# Patient Record
Sex: Male | Born: 1950 | Race: Black or African American | Hispanic: No | Marital: Married
Health system: Southern US, Community
[De-identification: ages and names within clinical notes are randomized; demographics above are authoritative.]

## PROBLEM LIST (undated history)

## (undated) DIAGNOSIS — K219 Gastro-esophageal reflux disease without esophagitis: Secondary | ICD-10-CM

## (undated) DIAGNOSIS — I10 Essential (primary) hypertension: Secondary | ICD-10-CM

## (undated) DIAGNOSIS — R569 Unspecified convulsions: Secondary | ICD-10-CM

## (undated) DIAGNOSIS — I82409 Acute embolism and thrombosis of unspecified deep veins of unspecified lower extremity: Secondary | ICD-10-CM

## (undated) DIAGNOSIS — E785 Hyperlipidemia, unspecified: Secondary | ICD-10-CM

## (undated) HISTORY — PX: BRAIN SURGERY: SHX531

---

## 2017-01-16 ENCOUNTER — Emergency Department: Payer: Medicare PPO

## 2017-01-16 ENCOUNTER — Inpatient Hospital Stay
Admission: EM | Admit: 2017-01-16 | Discharge: 2017-01-22 | DRG: 393 | Disposition: A | Discharge status: Skilled Nursing Facility | Payer: Medicare PPO | Attending: Internal Medicine | Admitting: Internal Medicine

## 2017-01-16 ENCOUNTER — Inpatient Hospital Stay: Payer: Medicare PPO

## 2017-01-16 ENCOUNTER — Encounter: Payer: Self-pay | Admitting: Emergency Medicine

## 2017-01-16 ENCOUNTER — Other Ambulatory Visit: Payer: Self-pay

## 2017-01-16 DIAGNOSIS — K9423 Gastrostomy malfunction: Principal | ICD-10-CM | POA: Diagnosis present

## 2017-01-16 DIAGNOSIS — Z79899 Other long term (current) drug therapy: Secondary | ICD-10-CM

## 2017-01-16 DIAGNOSIS — T45515A Adverse effect of anticoagulants, initial encounter: Secondary | ICD-10-CM | POA: Diagnosis present

## 2017-01-16 DIAGNOSIS — R569 Unspecified convulsions: Secondary | ICD-10-CM | POA: Diagnosis present

## 2017-01-16 DIAGNOSIS — I10 Essential (primary) hypertension: Secondary | ICD-10-CM | POA: Diagnosis present

## 2017-01-16 DIAGNOSIS — Z7901 Long term (current) use of anticoagulants: Secondary | ICD-10-CM | POA: Diagnosis not present

## 2017-01-16 DIAGNOSIS — D496 Neoplasm of unspecified behavior of brain: Secondary | ICD-10-CM | POA: Diagnosis present

## 2017-01-16 DIAGNOSIS — R5383 Other fatigue: Secondary | ICD-10-CM | POA: Diagnosis present

## 2017-01-16 DIAGNOSIS — A419 Sepsis, unspecified organism: Secondary | ICD-10-CM | POA: Diagnosis present

## 2017-01-16 DIAGNOSIS — Z8673 Personal history of transient ischemic attack (TIA), and cerebral infarction without residual deficits: Secondary | ICD-10-CM

## 2017-01-16 DIAGNOSIS — N390 Urinary tract infection, site not specified: Secondary | ICD-10-CM | POA: Diagnosis present

## 2017-01-16 DIAGNOSIS — F329 Major depressive disorder, single episode, unspecified: Secondary | ICD-10-CM | POA: Diagnosis present

## 2017-01-16 DIAGNOSIS — R791 Abnormal coagulation profile: Secondary | ICD-10-CM | POA: Diagnosis present

## 2017-01-16 DIAGNOSIS — Z86718 Personal history of other venous thrombosis and embolism: Secondary | ICD-10-CM

## 2017-01-16 DIAGNOSIS — R000 Tachycardia, unspecified: Secondary | ICD-10-CM | POA: Diagnosis present

## 2017-01-16 DIAGNOSIS — R05 Cough: Secondary | ICD-10-CM

## 2017-01-16 DIAGNOSIS — R059 Cough, unspecified: Secondary | ICD-10-CM

## 2017-01-16 HISTORY — DX: Essential (primary) hypertension: I10

## 2017-01-16 LAB — URINALYSIS, COMPLETE (UACMP) WITH MICROSCOPIC
Bacteria, UA: NONE SEEN
Bilirubin Urine: NEGATIVE
GLUCOSE, UA: NEGATIVE mg/dL
Hgb urine dipstick: NEGATIVE
KETONES UR: NEGATIVE mg/dL
LEUKOCYTES UA: NEGATIVE
NITRITE: NEGATIVE
PH: 7 (ref 5.0–8.0)
Protein, ur: NEGATIVE mg/dL
Specific Gravity, Urine: 1.012 (ref 1.005–1.030)
Squamous Epithelial / LPF: NONE SEEN

## 2017-01-16 LAB — URINALYSIS, ROUTINE W REFLEX MICROSCOPIC

## 2017-01-16 LAB — CBC WITH DIFFERENTIAL/PLATELET
Basophils Absolute: 0.1 10*3/uL (ref 0–0.1)
Basophils Relative: 1 %
Eosinophils Absolute: 0.1 10*3/uL (ref 0–0.7)
Eosinophils Relative: 1 %
HEMATOCRIT: 46 % (ref 40.0–52.0)
HEMOGLOBIN: 15.1 g/dL (ref 13.0–18.0)
LYMPHS ABS: 1.7 10*3/uL (ref 1.0–3.6)
LYMPHS PCT: 19 %
MCH: 31.1 pg (ref 26.0–34.0)
MCHC: 32.9 g/dL (ref 32.0–36.0)
MCV: 94.7 fL (ref 80.0–100.0)
Monocytes Absolute: 0.6 10*3/uL (ref 0.2–1.0)
Monocytes Relative: 7 %
NEUTROS ABS: 6.8 10*3/uL — AB (ref 1.4–6.5)
Neutrophils Relative %: 72 %
Platelets: 495 10*3/uL — ABNORMAL HIGH (ref 150–440)
RBC: 4.85 MIL/uL (ref 4.40–5.90)
RDW: 15.9 % — ABNORMAL HIGH (ref 11.5–14.5)
WBC: 9.2 10*3/uL (ref 3.8–10.6)

## 2017-01-16 LAB — COMPREHENSIVE METABOLIC PANEL
ALBUMIN: 3.4 g/dL — AB (ref 3.5–5.0)
ALT: 86 U/L — ABNORMAL HIGH (ref 17–63)
ANION GAP: 12 (ref 5–15)
AST: 54 U/L — ABNORMAL HIGH (ref 15–41)
Alkaline Phosphatase: 247 U/L — ABNORMAL HIGH (ref 38–126)
BILIRUBIN TOTAL: 0.6 mg/dL (ref 0.3–1.2)
BUN: 16 mg/dL (ref 6–20)
CHLORIDE: 96 mmol/L — AB (ref 101–111)
CO2: 26 mmol/L (ref 22–32)
Calcium: 9.9 mg/dL (ref 8.9–10.3)
Creatinine, Ser: 0.78 mg/dL (ref 0.61–1.24)
GFR calc Af Amer: >60 mL/min (ref >60)
GFR calc non Af Amer: >60 mL/min (ref >60)
GLUCOSE: 118 mg/dL — AB (ref 65–99)
POTASSIUM: 4.7 mmol/L (ref 3.5–5.1)
Sodium: 134 mmol/L — ABNORMAL LOW (ref 135–145)
TOTAL PROTEIN: 8.7 g/dL — AB (ref 6.5–8.1)

## 2017-01-16 LAB — PROTIME-INR
INR: 1.91
Prothrombin Time: 21.7 seconds — ABNORMAL HIGH (ref 11.4–15.2)

## 2017-01-16 LAB — MRSA PCR SCREENING: MRSA BY PCR: NEGATIVE

## 2017-01-16 LAB — LACTIC ACID, PLASMA
LACTIC ACID, VENOUS: 1.9 mmol/L (ref 0.5–1.9)
LACTIC ACID, VENOUS: 1.7 mmol/L (ref 0.5–1.9)
LACTIC ACID, VENOUS: 1.9 mmol/L (ref 0.5–1.9)
Lactic Acid, Venous: 2.1 mmol/L (ref 0.5–1.9)

## 2017-01-16 LAB — PROCALCITONIN: Procalcitonin: <0.1 ng/mL

## 2017-01-16 LAB — PHENYTOIN LEVEL, TOTAL: PHENYTOIN LVL: 17.5 ug/mL (ref 10.0–20.0)

## 2017-01-16 MED ORDER — ACETAMINOPHEN 325 MG PO TABS
ORAL_TABLET | ORAL | Status: AC
  Administered 2017-01-16: 650 mg via ORAL
  Filled 2017-01-16: qty 2

## 2017-01-16 MED ORDER — PIPERACILLIN-TAZOBACTAM 3.375 G IVPB 30 MIN
3.3750 g | Freq: Once | INTRAVENOUS | Status: AC
  Administered 2017-01-16: 3.375 g via INTRAVENOUS
  Filled 2017-01-16 (×2): qty 50

## 2017-01-16 MED ORDER — FREE WATER
180.0000 mL | Freq: Every day | Status: DC
  Administered 2017-01-16 – 2017-01-22 (×25): 180 mL

## 2017-01-16 MED ORDER — LEVETIRACETAM 100 MG/ML PO SOLN
1000.0000 mg | Freq: Two times a day (BID) | ORAL | Status: DC
  Administered 2017-01-16 – 2017-01-22 (×12): 1000 mg
  Filled 2017-01-16 (×14): qty 10

## 2017-01-16 MED ORDER — PHENYTOIN 125 MG/5ML PO SUSP
150.0000 mg | Freq: Three times a day (TID) | ORAL | Status: DC
  Administered 2017-01-16 – 2017-01-22 (×19): 150 mg
  Filled 2017-01-16 (×21): qty 8

## 2017-01-16 MED ORDER — PIPERACILLIN-TAZOBACTAM 3.375 G IVPB
3.3750 g | Freq: Three times a day (TID) | INTRAVENOUS | Status: DC
  Administered 2017-01-16 – 2017-01-18 (×5): 3.375 g via INTRAVENOUS
  Filled 2017-01-16 (×5): qty 50

## 2017-01-16 MED ORDER — SULFAMETHOXAZOLE-TRIMETHOPRIM 800-160 MG PO TABS
1.0000 | ORAL_TABLET | Freq: Two times a day (BID) | ORAL | Status: DC
  Filled 2017-01-16 (×2): qty 1

## 2017-01-16 MED ORDER — AMANTADINE HCL 50 MG/5ML PO SYRP
100.0000 mg | ORAL_SOLUTION | Freq: Two times a day (BID) | ORAL | Status: DC
  Administered 2017-01-16 – 2017-01-22 (×12): 100 mg
  Filled 2017-01-16 (×14): qty 10

## 2017-01-16 MED ORDER — VANCOMYCIN HCL 10 G IV SOLR
1500.0000 mg | Freq: Once | INTRAVENOUS | Status: AC
  Administered 2017-01-16: 15:00:00 1500 mg via INTRAVENOUS
  Filled 2017-01-16 (×2): qty 1500

## 2017-01-16 MED ORDER — PHENYTOIN 125 MG/5ML PO SUSP
150.0000 mg | Freq: Once | ORAL | Status: AC
  Administered 2017-01-16: 150 mg via ORAL
  Filled 2017-01-16: qty 8

## 2017-01-16 MED ORDER — MAGIC MOUTHWASH
10.0000 mL | Freq: Three times a day (TID) | ORAL | Status: DC
  Administered 2017-01-16 – 2017-01-22 (×16): 10 mL via ORAL
  Filled 2017-01-16 (×14): qty 10

## 2017-01-16 MED ORDER — METOPROLOL TARTRATE 50 MG PO TABS
100.0000 mg | ORAL_TABLET | Freq: Once | ORAL | Status: AC
  Administered 2017-01-16: 100 mg via ORAL
  Filled 2017-01-16: qty 2

## 2017-01-16 MED ORDER — TAMSULOSIN HCL 0.4 MG PO CAPS
0.4000 mg | ORAL_CAPSULE | Freq: Once | ORAL | Status: AC
Start: 2017-01-16 — End: 2017-01-16
  Administered 2017-01-16: 0.4 mg via ORAL
  Filled 2017-01-16: qty 1

## 2017-01-16 MED ORDER — VANCOMYCIN HCL IN DEXTROSE 1-5 GM/200ML-% IV SOLN
1000.0000 mg | Freq: Two times a day (BID) | INTRAVENOUS | Status: DC
  Administered 2017-01-17 – 2017-01-18 (×3): 1000 mg via INTRAVENOUS
  Filled 2017-01-16 (×4): qty 200

## 2017-01-16 MED ORDER — CEFTRIAXONE SODIUM 2 G IJ SOLR
2.0000 g | Freq: Once | INTRAMUSCULAR | Status: AC
  Administered 2017-01-16: 2 g via INTRAVENOUS
  Filled 2017-01-16: qty 2

## 2017-01-16 MED ORDER — DIATRIZOATE MEGLUMINE & SODIUM 66-10 % PO SOLN
60.0000 mL | Freq: Once | ORAL | Status: AC
  Administered 2017-01-16: 60 mL

## 2017-01-16 MED ORDER — SENNOSIDES 8.8 MG/5ML PO SYRP
8.8000 mg | ORAL_SOLUTION | Freq: Every day | ORAL | Status: DC
  Administered 2017-01-16 – 2017-01-21 (×5): 8.8 mg
  Filled 2017-01-16 (×7): qty 5

## 2017-01-16 MED ORDER — ATORVASTATIN CALCIUM 10 MG PO TABS
10.0000 mg | ORAL_TABLET | Freq: Every day | ORAL | Status: DC
  Administered 2017-01-16 – 2017-01-21 (×6): 10 mg
  Filled 2017-01-16 (×7): qty 1

## 2017-01-16 MED ORDER — MELATONIN 5 MG PO TABS
5.0000 mg | ORAL_TABLET | Freq: Every day | ORAL | Status: DC
  Administered 2017-01-17 – 2017-01-21 (×5): 5 mg
  Filled 2017-01-16 (×7): qty 1

## 2017-01-16 MED ORDER — ACETAMINOPHEN 325 MG PO TABS
650.0000 mg | ORAL_TABLET | Freq: Four times a day (QID) | ORAL | Status: DC | PRN
  Administered 2017-01-17: 21:00:00 650 mg
  Filled 2017-01-16: qty 2

## 2017-01-16 MED ORDER — OSMOLITE 1.5 CAL PO LIQD
237.0000 mL | Freq: Every day | ORAL | Status: DC
  Administered 2017-01-16 – 2017-01-22 (×25): 237 mL

## 2017-01-16 MED ORDER — POLYETHYLENE GLYCOL 3350 17 G PO PACK
17.0000 g | PACK | Freq: Every day | ORAL | Status: DC
  Administered 2017-01-16 – 2017-01-20 (×4): 17 g
  Filled 2017-01-16 (×6): qty 1

## 2017-01-16 MED ORDER — VANCOMYCIN HCL IN DEXTROSE 1-5 GM/200ML-% IV SOLN: 1000.0000 mg | Freq: Once | INTRAVENOUS | Status: DC

## 2017-01-16 MED ORDER — ACETAMINOPHEN 325 MG PO TABS
650.0000 mg | ORAL_TABLET | Freq: Once | ORAL | Status: AC
Start: 2017-01-16 — End: 2017-01-16
  Administered 2017-01-16: 650 mg via ORAL

## 2017-01-16 MED ORDER — PRO-STAT SUGAR FREE PO LIQD
30.0000 mL | Freq: Two times a day (BID) | ORAL | Status: DC
  Administered 2017-01-16 – 2017-01-22 (×13): 30 mL

## 2017-01-16 MED ORDER — LEVETIRACETAM 500 MG PO TABS: 1000.0000 mg | ORAL_TABLET | Freq: Two times a day (BID) | ORAL | Status: DC

## 2017-01-16 MED ORDER — METOPROLOL TARTRATE 5 MG/5ML IV SOLN
5.0000 mg | Freq: Once | INTRAVENOUS | Status: AC
  Administered 2017-01-16: 5 mg via INTRAVENOUS
  Filled 2017-01-16: qty 5

## 2017-01-16 MED ORDER — LEVETIRACETAM 500 MG PO TABS
1000.0000 mg | ORAL_TABLET | Freq: Once | ORAL | Status: AC
  Administered 2017-01-16: 1000 mg via ORAL
  Filled 2017-01-16: qty 2

## 2017-01-16 MED ORDER — SODIUM CHLORIDE 0.9 % IV SOLN
INTRAVENOUS | Status: AC
  Administered 2017-01-16 – 2017-01-17 (×2): via INTRAVENOUS

## 2017-01-16 MED ORDER — METOPROLOL TARTRATE 50 MG PO TABS
100.0000 mg | ORAL_TABLET | Freq: Two times a day (BID) | ORAL | Status: DC
  Administered 2017-01-16 – 2017-01-22 (×12): 100 mg
  Filled 2017-01-16 (×12): qty 2

## 2017-01-16 NOTE — ED Notes (Signed)
[place] attempted to flush G-tube but no success

## 2017-01-16 NOTE — NC FL2 (Signed)
[place] LEVEL OF CARE SCREENING TOOL     IDENTIFICATION  Patient Name: [place] Birthdate: 07-08-1950 Sex: male Admission Date (Current Location): 01/16/2017  [facility] and [place] Number:  Selena Lesser ([id number] [phone]) Facility and Address:  [facility], [address], [place], [place] [address]      Provider Number: [id number]  Attending Physician Name and Address:  Nicholes Mango, MD  Relative Name and Phone Number:       Current Level of Care: Hospital Recommended Level of Care: [place] Prior Approval Number:    Date Approved/Denied:   PASRR Number: [id number])  Discharge Plan: SNF    Current Diagnoses: Patient Active Problem List   Diagnosis Date Noted  . Sepsis ([place]) 01/16/2017    Orientation RESPIRATION BLADDER Height & Weight     Self  Normal Continent Weight: 188 lb 11.2 oz (85.6 kg) Height:  5\' 3"  (160 cm)  BEHAVIORAL SYMPTOMS/MOOD NEUROLOGICAL BOWEL NUTRITION STATUS      Continent Feeding tube(PEG)  AMBULATORY STATUS COMMUNICATION OF NEEDS Skin   Extensive Assist Verbally Normal                       Personal Care Assistance Level of Assistance  Bathing, Feeding, Dressing Bathing Assistance: Limited assistance Feeding assistance: Limited assistance Dressing Assistance: Limited assistance     Functional Limitations Info  Sight, Hearing, Speech Sight Info: Impaired Hearing Info: Impaired Speech Info: Impaired    SPECIAL CARE FACTORS FREQUENCY  PT (By licensed PT), OT (By licensed OT)     PT Frequency: (5) OT Frequency: (5)            Contractures      Additional Factors Info  Code Status, Allergies Code Status Info: (not on file. ) Allergies Info: (not on file. )           Current Medications (01/16/2017):  This is the current hospital active medication list Current Facility-Administered Medications  Medication Dose Route Frequency Provider Last Rate Last Dose  . 0.9  %  sodium chloride infusion   Intravenous Continuous Gouru, Aruna, MD 75 mL/hr at 01/16/17 1341    . acetaminophen (TYLENOL) tablet 650 mg  650 mg Per Tube Q6H PRN Gouru, Aruna, MD      . amantadine (SYMMETREL) 50 MG/5ML solution 100 mg  100 mg Per Tube BID Gouru, Aruna, MD   100 mg at 01/16/17 1724  . atorvastatin (LIPITOR) tablet 10 mg  10 mg Per Tube q1800 Gouru, Aruna, MD   10 mg at 01/16/17 1724  . feeding supplement (OSMOLITE 1.5 CAL) liquid 237 mL  237 mL Per Tube 5 X Daily Gouru, Aruna, MD   237 mL at 01/16/17 1724  . feeding supplement (PRO-STAT SUGAR FREE 64) liquid 30 mL  30 mL Per Tube BID Gouru, Aruna, MD   30 mL at 01/16/17 1725  . free water 180 mL  180 mL Per Tube 5 X Daily Gouru, Aruna, MD   180 mL at 01/16/17 1725  . levETIRAcetam (KEPPRA) 100 MG/ML solution 1,000 mg  1,000 mg Per Tube BID Gouru, Aruna, MD      . Melatonin TABS 5 mg  5 mg Per Tube QHS Gouru, Aruna, MD      . metoprolol tartrate (LOPRESSOR) tablet 100 mg  100 mg Per Tube BID Gouru, Aruna, MD      . phenytoin (DILANTIN) 125 MG/5ML suspension 150 mg  150 mg Per Tube Q8H Nicholes Mango, MD  150 mg at 01/16/17 1724  . piperacillin-tazobactam (ZOSYN) IVPB 3.375 g  3.375 g Intravenous Q8H James, Teldrin D, RPH      . polyethylene glycol (MIRALAX / GLYCOLAX) packet 17 g  17 g Per Tube Daily Gouru, Aruna, MD   17 g at 01/16/17 1724  . sennosides (SENOKOT) 8.8 MG/5ML syrup 8.8 mg  8.8 mg Per Tube Daily Gouru, Aruna, MD   8.8 mg at 01/16/17 1724  . [START ON 01/17/2017] vancomycin (VANCOCIN) IVPB 1000 mg/200 mL premix  1,000 mg Intravenous Q12H Larene Beach, [facility]         Discharge Medications: Please see discharge summary for a list of discharge medications.  Relevant Imaging Results:  Relevant Lab Results:   Additional Information (SSN: [id number])  Braeleigh Pyper, Veronia Beets, LCSW

## 2017-01-16 NOTE — Clinical Social Work Note (Signed)
Clinical Social Work Assessment  Patient Details  Name: [place] Jackson MRN: [id number] Date of Birth: 03-20-1950  Date of referral:  01/16/17               Reason for consult:  Other (Comment Required)(From [place] [facility] STR. )                Permission sought to share information with:  Chartered certified accountant granted to share information::  Yes, Verbal Permission Granted  Name::        Agency::     Relationship::     Contact Information:     Housing/Transportation Living arrangements for the past 2 months:  Single Family Home Source of Information:  Adult Children Patient Interpreter Needed:  None Criminal Activity/Legal Involvement Pertinent to Current Situation/Hospitalization:  No - Comment as needed Significant Relationships:  Adult Children, Siblings, Spouse Lives with:  Spouse Do you feel safe going back to the place where you live?  Yes Need for family participation in patient care:  Yes (Comment)  Care giving concerns:  Patient came to [place] from [place] where he was at short term rehab for 2 days.    Social Worker assessment / plan:  Holiday representative (CSW) reviewed chart and noted that patient is from [place]. Per [place] liaison patient is a short term rehab resident and has only been there for 2 days and came from [facility]. Per [place] patient can return to [place] if a new [place] SNF authorization through [place] is received. CSW met with patient's daughter [place] outside of patient's room. Per daughter patient and his wife [place] live in [place], [place] and daughter lives in [place]. Daughter is agreeable for patient to return to [place]. CSW explained to daughter that patient's [place] will have to approve SNF all over again before he can return to [place]. Daughter verbalized her understanding. FL2 complete. CSW will continue to follow and assist as needed.   Employment status:  Disabled (Comment on whether or not currently receiving  Disability) Insurance information:  [place], [employer] PT Recommendations:  Not assessed at this time Information / Referral to community resources:  [place]  Patient/Family's Response to care:  Patient's daughter is agreeable for patient to return to [place].   Patient/Family's Understanding of and Emotional Response to Diagnosis, Current Treatment, and Prognosis:  Patient's daughter was very pleasant and thanked CSW for assistance.   Emotional Assessment Appearance:  Appears stated age Attitude/Demeanor/Rapport:  Unable to Assess Affect (typically observed):  Unable to Assess Orientation:  Oriented to Self, Fluctuating Orientation (Suspected and/or reported Sundowners) Alcohol / Substance use:  Not Applicable Psych involvement (Current and /or in the community):  No (Comment)  Discharge Needs  Concerns to be addressed:  Discharge Planning Concerns Readmission within the last 30 days:  No Current discharge risk:  Dependent with Mobility, Chronically ill Barriers to Discharge:  Continued Medical Work up   [employer], Veronia Beets, LCSW 01/16/2017, 6:24 PM

## 2017-01-16 NOTE — ED Provider Notes (Signed)
Patient has had a prolonged ER course.  I placed a G-tube for him.  He was an 32 [place] which is what he was using.  Family is concerned because he has dealt with sepsis in the past.  His lactic acid is slightly elevated at 2.1 and he is tachycardic otherwise I am not convinced that he is septic.  He would need hospital observation.  At this point he would have systemic inflammatory response.  We gave him his home medications.  Initially family consider transferring him back to [facility] because [facility].  Currently they are comfortable with keeping him here.   Earleen Newport, MD 01/16/17 1139

## 2017-01-16 NOTE — ED Notes (Signed)
ED Provider at bedside. 

## 2017-01-16 NOTE — ED Notes (Signed)
Family member at bedside; requests IV be removed from right arm as he has hx of blood clots and they were told to never have any IV placed there. Pt also reports recent treatment for UTI; has days remaining on antibiotics.

## 2017-01-16 NOTE — H&P (Signed)
[place] [facility] [place] NAME: [place]    MR#:  [id number]  DATE OF BIRTH:  03-Mar-1950  DATE OF ADMISSION:  01/16/2017  PRIMARY CARE PHYSICIAN: System, Provider Not In   REQUESTING/REFERRING PHYSICIAN: Earleen Newport, MD  CHIEF COMPLAINT:  Clogged PEG tube, eventually patient became tachycardic in the ED  HISTORY OF PRESENT ILLNESS:  [place]  is a 67 y.o. 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  G-tube was replaced in the emergency department but patient was tachycardic and has low-grade fever and elevated lactic acid.  Patient is started on broad-spectrum IV antibiotics for code sepsis and hospitalist team was called to admit the patient.  Patient is lethargic during my examination.  Daughter is at bedside  PAST MEDICAL HISTORY:   Past Medical History:  Diagnosis Date  . Hypertension     PAST SURGICAL HISTOIRY:  Status post brain tumor resection  SOCIAL HISTORY:   Social History   Tobacco Use  . Smoking status: Not on file  Substance Use Topics  . Alcohol use: Not on file    FAMILY HISTORY:  No family history on file.  DRUG ALLERGIES:  Not on File  REVIEW OF SYSTEMS:  Review of system unobtainable as the patient is lethargic during my examination  MEDICATIONS AT HOME:   Prior to Admission medications   Medication Sig Start Date End Date Taking? Authorizing Provider  acetaminophen (TYLENOL) 325 MG tablet Place 650 mg into feeding tube every 6 (six) hours as needed.   Yes [provider]  amantadine (SYMMETREL) 50 MG/5ML solution Place 100 mg into feeding tube 2 (two) times daily.   Yes [provider]  atorvastatin  (LIPITOR) 10 MG tablet Place 10 mg into feeding tube daily.    Yes [provider]  bacitracin ointment Apply 1 application topically 2 (two) times daily.   Yes [provider]  levETIRAcetam (KEPPRA) 1000 MG tablet Place 1,000 mg into feeding tube 2 (two) times daily.   Yes [provider]  Melatonin 3 MG TABS Place 6 mg into feeding tube at bedtime.   Yes [provider]  metoprolol tartrate (LOPRESSOR) 100 MG tablet Place 100 mg into feeding tube 2 (two) times daily.   Yes [provider]  phenytoin (DILANTIN) 125 MG/5ML suspension Place 150 mg into feeding tube every 8 (eight) hours.   Yes [provider]  polyethylene glycol (MIRALAX / GLYCOLAX) packet Place 17 g into feeding tube daily.   Yes [provider]  senna (SENOKOT) 8.6 MG tablet Place 1 tablet into feeding tube daily.   Yes [provider]  sulfamethoxazole-trimethoprim (BACTRIM DS,SEPTRA DS) 800-160 MG tablet Place 1 tablet into feeding tube 2 (two) times daily.    Yes [provider]  tamsulosin (FLOMAX) 0.4 MG CAPS capsule 0.4 mg daily after breakfast.   Yes [provider]  warfarin (COUMADIN) 5 MG tablet Place 5 mg into feeding tube daily.    Yes [provider]      VITAL SIGNS:  Blood pressure (!) 133/97, pulse (!) 104, temperature 97.9 F (36.6 C), temperature source Oral, resp. rate 20, height 5\' 3"  (1.6 m), weight 85.6 kg (188 lb 11.2  oz), SpO2 92 %.  PHYSICAL EXAMINATION:  GENERAL:  67 y.o.-year-old patient lying in the bed with no acute distress.  EYES: Pupils equal, round, reactive to light and accommodation. No scleral icterus.  HEENT: Head atraumatic, normocephalic.  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 No thyroid enlargement, no tenderness.  LUNGS: Normal breath sounds bilaterally, no wheezing, rales,rhonchi or crepitation. No use of accessory muscles of  respiration.  CARDIOVASCULAR: S1, S2 normal. No murmurs, rubs, or gallops.  ABDOMEN: Soft, nontender, nondistended. Bowel sounds present. No organomegaly or mass.  EXTREMITIES: No pedal edema, cyanosis, or clubbing.  NEUROLOGIC: Patient is arousable but falling asleep lethargic, scar on the left side of the brain is healing well PSYCHIATRIC: The patient is disoriented SKIN: No obvious rash, lesion, or ulcer.   LABORATORY PANEL:   CBC Recent Labs  Lab 01/16/17 0640  WBC 9.2  HGB 15.1  HCT 46.0  PLT 495*   ------------------------------------------------------------------------------------------------------------------  Chemistries  Recent Labs  Lab 01/16/17 0640  NA 134*  K 4.7  CL 96*  CO2 26  GLUCOSE 118*  BUN 16  CREATININE 0.78  CALCIUM 9.9  AST 54*  ALT 86*  ALKPHOS 247*  BILITOT 0.6   ------------------------------------------------------------------------------------------------------------------  Cardiac Enzymes No results for input(s): TROPONINI in the last 168 hours. ------------------------------------------------------------------------------------------------------------------  RADIOLOGY:  Dg Chest 1 View  Result Date: 01/16/2017 CLINICAL DATA:  Fever EXAM: CHEST 1 VIEW COMPARISON:  None. FINDINGS: Heart is normal size. Tortuosity of the thoracic aorta. No confluent airspace opacities or effusions. No acute bony abnormality. 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 IMPRESSION: No active disease. Electronically Signed   By: Rolm Baptise M.D.   On: 01/16/2017 09:24   Dg Abdomen 1 View  Result Date: 01/16/2017 CLINICAL DATA:  Enteric tube replacement EXAM: ABDOMEN - 1 VIEW COMPARISON:  None. FINDINGS: Gastrostomy tube terminates over the body of the stomach. Enteric contrast pools in the gastric fundus. Retained enteric contrast is noted throughout the large bowel. No evidence of extraluminal enteric contrast. No disproportionately dilated small bowel  loops. No evidence of pneumatosis or pneumoperitoneum. VP shunt catheter overlies bilateral abdomen with the tip not seen on this radiograph. No radiopaque nephrolithiasis. Moderate lumbar spondylosis. IMPRESSION: Gastrostomy tube terminates over the body of the stomach. Enteric contrast pools in the gastric fundus. No evidence of extraluminal enteric contrast. Nonobstructive bowel gas pattern. Electronically Signed   By: Ilona Sorrel M.D.   On: 01/16/2017 09:06   Dg Chest Port 1 View  Result Date: 01/16/2017 CLINICAL DATA:  Sepsis.  Hypertension. EXAM: PORTABLE CHEST 1 VIEW COMPARISON:  01/17/2016 earlier the same date. FINDINGS: 1308 hr. The heart size and mediastinal contours are stable. There are lower lung volumes with mildly increased patchy opacity at both lung bases, probably atelectasis. Early aspiration is possible. There is no pneumothorax or significant pleural effusion. 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 No acute osseous findings. IMPRESSION: Mildly increased patchy opacities at both lung bases compared with earlier study, radiographically favored to reflect atelectasis. Early aspiration cannot be excluded in this clinical setting. Electronically Signed   By: Richardean Sale M.D.   On: 01/16/2017 13:34    EKG:   Orders placed or performed during the hospital encounter of 01/16/17  . ED EKG 12-Lead  . ED EKG 12-Lead  . EKG 12-Lead  . EKG 12-Lead    IMPRESSION AND PLAN:   [place]  is a 67 y.o. 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  G-tube was replaced in the emergency department but patient was tachycardic and has low-grade fever and elevated lactic acid.  #Sepsis-unclear  etiology Admit to MedSurg unit Patient meets septic criteria with tachycardia elevated lactic acid and fever at the time of admission Broad-spectrum IV antibiotics Blood cultures, urine cultures ordered and chest x-ray Hydrate with IV fluids and monitor lactic acid levels  #Mechanical obstruction of the PEG tube PEG tube was replaced in the emergency department and functioning well now Resume feeds and monitor closely  #History of CVA during brain tumor resection Patient is on Coumadin INR is subtherapeutic Coumadin management by pharmacy  #History of brain tumor status post resection in October 2018  at [place] follow-up with [facility] neurosurgery as recommended No interventions needed at this time   #History of seizures Check Keppra and Dilantin level and resume if levels are normal  Provide GI and DVT prophylaxis with coumadin      All the records are reviewed and case discussed with ED provider. Management plans discussed with the patient, family and they are in agreement.  CODE STATUS: fc ; patient's wife, son and daughter healthcare power of attorney  TOTAL TIME TAKING CARE OF THIS PATIENT: 45 minutes.   Note: This dictation was prepared with Dragon dictation along with smaller phrase technology. Any transcriptional errors that result from this process are unintentional.  Nicholes Mango M.D on 01/16/2017 at 2:08 PM  Between 7am to 6pm - Pager - [phone]  After 6pm go to [email or web address] - password [email or web address]  [facility]  Office  [phone]  CC: Primary care physician; System, Provider Not In

## 2017-01-16 NOTE — Progress Notes (Signed)
ANTIBIOTIC CONSULT NOTE - INITIAL  Pharmacy Consult for Vancomycin and Zosyn Indication: sepsis   Not on File  Patient Measurements: Height: 5\' 3"  (160 cm) Weight: 188 lb 11.2 oz (85.6 kg) IBW/kg (Calculated) : 56.9 Adjusted Body Weight:   Vital Signs: Temp: 97.9 F (36.6 C) (01/09 1302) Temp Source: Oral (01/09 1302) BP: 133/97 (01/09 1302) Pulse Rate: 104 (01/09 1302) Intake/Output from previous day: No intake/output data recorded. Intake/Output from this shift: Total I/O In: 50 [IV Piggyback:50] Out: -   Labs: Recent Labs    01/16/17 0640  WBC 9.2  HGB 15.1  PLT 495*  CREATININE 0.78   Estimated Creatinine Clearance: 85.5 mL/min (by C-G formula based on SCr of 0.78 mg/dL). No results for input(s): VANCOTROUGH, VANCOPEAK, VANCORANDOM, GENTTROUGH, GENTPEAK, GENTRANDOM, TOBRATROUGH, TOBRAPEAK, TOBRARND, AMIKACINPEAK, AMIKACINTROU, AMIKACIN in the last 72 hours.   Microbiology: No results found for this or any previous visit (from the past 720 hour(s)).  Medical History: Past Medical History:  Diagnosis Date  . Hypertension     Medications:  Medications Prior to Admission  Medication Sig Dispense Refill Last Dose  . acetaminophen (TYLENOL) 325 MG tablet Place 650 mg into feeding tube every 6 (six) hours as needed.   prn at prn  . amantadine (SYMMETREL) 50 MG/5ML solution Place 100 mg into feeding tube 2 (two) times daily.   01/15/2017 at 1800  . atorvastatin (LIPITOR) 10 MG tablet Place 10 mg into feeding tube daily.    01/15/2017 at 1800  . bacitracin ointment Apply 1 application topically 2 (two) times daily.   01/15/2017 at 1500  . levETIRAcetam (KEPPRA) 1000 MG tablet Place 1,000 mg into feeding tube 2 (two) times daily.   01/15/2017 at 2100  . Melatonin 3 MG TABS Place 6 mg into feeding tube at bedtime.   01/15/2017 at 2000  . metoprolol tartrate (LOPRESSOR) 100 MG tablet Place 100 mg into feeding tube 2 (two) times daily.   01/15/2017 at 1800  . phenytoin (DILANTIN)  125 MG/5ML suspension Place 150 mg into feeding tube every 8 (eight) hours.   01/15/2017 at 2200  . polyethylene glycol (MIRALAX / GLYCOLAX) packet Place 17 g into feeding tube daily.   01/15/2017 at 0900  . senna (SENOKOT) 8.6 MG tablet Place 1 tablet into feeding tube daily.   01/15/2017 at 2000  . sulfamethoxazole-trimethoprim (BACTRIM DS,SEPTRA DS) 800-160 MG tablet Place 1 tablet into feeding tube 2 (two) times daily.    01/15/2017 at 1800  . tamsulosin (FLOMAX) 0.4 MG CAPS capsule 0.4 mg daily after breakfast.   01/15/2017 at 0900  . warfarin (COUMADIN) 5 MG tablet Place 5 mg into feeding tube daily.    01/15/2017 at 1800   Scheduled:  . amantadine  100 mg Per Tube BID  . atorvastatin  10 mg Per Tube q1800  . levETIRAcetam  1,000 mg Per Tube BID  . Melatonin  5 mg Per Tube QHS  . metoprolol tartrate  100 mg Per Tube BID  . phenytoin  150 mg Per Tube Q8H  . polyethylene glycol  17 g Per Tube Daily  . sennosides  8.8 mg Per Tube Daily  . sulfamethoxazole-trimethoprim  1 tablet Per Tube BID   Assessment: Pharmacy consulted to dose and monitor Vancomycin and Zosyn in this 67 year old male being treated for sepsis.   Goal of Therapy:  Vancomycin trough level 15-20 mcg/ml  Plan:  Vancomycin: Will give Vancomycin 1500 mg IV x 1 then start Vancomycin 1 g IV  q12 hours.   Zosyn: Will start Zosyn 3.375 g IV q8h  Yamil Oelke D 01/16/2017,1:47 PM

## 2017-01-16 NOTE — ED Notes (Signed)
All meds given per tube; tube flushed before and after

## 2017-01-16 NOTE — Progress Notes (Signed)
Initial Nutrition Assessment  DOCUMENTATION CODES:   Obesity unspecified  INTERVENTION:  Provide Osmolite 1.5 Cal 1 can 5 times daily + Pro-Stat 30 ml BID via G-tube. Provides 1975 kcal, 105 grams protein, 905 mL H2O daily.  Recommend free water flush of 90 mL before and after each bolus tube feeding. Provides 1805 mL H2O daily including water in tube feeding.  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   -According to paper chart patient is on Isosource 1.5 270 mL 5 times daily (0800, 1100, 1600, 1800, 2000) at [employer]. This is 1350 mL of tube feeding, which provides 2025 kcal, 92 grams of protein, 1031 mL H2O, 20.5 grams fiber daily. He also receives free water flush of 180 mL 5 times daily. He is also on a pureed diet.  Met with patient, his wife, and his daughter at bedside. They confirm that patient is on a pureed diet, but report he only eats small bites at a time. Asked family about TF provision, because as Isosource 1.5 comes in 250 mL bottles, if they were going to provide 270 mL at each bolus feeding, it would be one bottle + 20 mL from another. Patient's daughter, who has spent time with him at  [facility], reports they only provide 1 bottle 5 times daily.  They report patient has lost a significant amount of weight. They report he was 239 lbs on 10/08/2016 and has now lost down to current weight of 188.7 lbs. That is a weight loss of 50.3 lbs (21% body weight) over 3 months, which is significant for time frame. No weights available in Epic, Chart Review, or paper chart to trend.  Access: 18 Fr. Kangaroo G-tube replaced 1/9 in ER after initial G-tube was clogged; terminates in body of stomach per abdominal x-ray 1/9  Medications reviewed and include: Keppra, Miralax, Senokot, NS @ 75 mL/hr, Zosyn, vancomycin.  Labs reviewed: Sodium 134, Chloride 96, Glucose 118, Lactic Acid 2.1.  Patient is at risk for malnutrition.  Discussed with RN.  NUTRITION - FOCUSED PHYSICAL EXAM:    Most Recent Value  Orbital Region  No depletion  Upper Arm Region  Mild depletion  Thoracic and Lumbar Region  No depletion  Buccal Region  No depletion  Temple Region  Moderate depletion [on right side,  difficult to assess left temporalis muscle as pt s/p 3 craniotomies/resections]  Clavicle Bone Region  Mild depletion  Clavicle and Acromion Bone Region  Mild depletion  Scapular Bone Region  Mild depletion  Dorsal Hand  No depletion  Patellar Region  Moderate depletion  Anterior Thigh Region  Moderate depletion  Posterior Calf Region  Moderate depletion  Edema (RD Assessment)  None  Hair  Reviewed  Eyes  Reviewed  Mouth  Reviewed  Skin  Reviewed  Nails  Reviewed     Diet Order:  Diet NPO time specified  EDUCATION NEEDS:   No education needs have been identified at this time  Skin:  Skin Assessment: Reviewed RN Assessment(RN assessment not completed at time of assessment as patient is a new admission)  Last BM:  Unknown  Height:   Ht Readings from Last 1 Encounters:  01/16/17 _0  (1.6 m)    Weight:   Wt Readings from Last 1 Encounters:  01/16/17 188 lb 11.2 oz (85.6 kg)     Ideal Body Weight:  56.4 kg  BMI:  Body mass index is 33.43 kg/m.  Estimated Nutritional Needs:   Kcal:  0998-3382 (MSJ x 1.3-1.5)  Protein:  85-110 grams (1-1.3 grams/kg)  Fluid:  1.7-2 L/day (30-35 mL/kg IBW)  Willey Blade, MS, RD, LDN Office: [phone] Pager: [phone] After Hours/Weekend Pager: [phone]

## 2017-01-16 NOTE — ED Notes (Signed)
Rocephin not in pyxis; called pharmacy and they will send

## 2017-01-16 NOTE — ED Provider Notes (Signed)
[facility] Emergency Department Provider Note _   First MD Initiated Contact with Patient 01/16/17 0600     (approximate)  I have reviewed the triage vital signs and the nursing notes.  Level 5 caveat: History limited secondary to nonverbal state HISTORY  Chief Complaint No chief complaint on file.    HPI Anthony Jackson is a 67 y.o. male with history of brain tumor status post resection hypertension presents to the emergency department via EMS from [facility] with "clogged G-tube and concern for possible UTI.  EMS personnel states unsure how long the G-tube is been clogged as per PICC resource staff.  In addition they were notified by staff at [facility] that the patient has had a urinary tract infection and unsure if he finished his course of antibiotics.  Patient noted to be tachycardic on presentation to the emergency department with a heart rate of 128 oral temperature of 99.4.   Past Medical History:  Diagnosis Date  . Hypertension     There are no active problems to display for this patient.  Past surgical history Brain tumor resection  Prior to Admission medications   Not on File    Allergies No known drug allergies No family history on file.  Social History Social History   Tobacco Use  . Smoking status: Not on file  Substance Use Topics  . Alcohol use: Not on file  . Drug use: Not on file    Review of Systems as per EMS report Constitutional: No fever/chills Eyes: No visual changes. ENT: No sore throat. Cardiovascular: Denies chest pain. Respiratory: Denies shortness of breath. Gastrointestinal: No abdominal pain.  No nausea, no vomiting.  No diarrhea.  No constipation. Genitourinary: Negative for dysuria. Musculoskeletal: Negative for neck pain.  Negative for back pain. Integumentary: Negative for rash. Neurological: Negative for headaches, focal weakness or  numbness.   ____________________________________________   PHYSICAL EXAM:  VITAL SIGNS: ED Triage Vitals [01/16/17 0603]  Enc Vitals Group     BP (!) 148/109     Pulse Rate (!) 128     Resp 18     Temp 99.4 F (37.4 C)     Temp Source Oral     SpO2 96 %     Weight      Height      Head Circumference      [facility] Flow      Pain Score      Pain Loc      Pain Edu?      Excl. in [place]?     Constitutional: Alert and oriented. Well appearing and in no acute distress. Eyes: Conjunctivae are normal. Head: Atraumatic. Mouth/Throat: Mucous membranes are moist. Oropharynx non-erythematous. Neck: No stridor.   Cardiovascular: Tachycardia, regular rhythm. Good peripheral circulation. Grossly normal heart sounds. Respiratory: Tachypnea no retractions. Lungs CTAB. Gastrointestinal: Soft and nontender. No distention.  Unable to flush G-tube Musculoskeletal: No lower extremity tenderness nor edema. No gross deformities of extremities. Skin:  Skin is warm, dry and intact. No rash noted. Psychiatric: Mood and affect are normal.   ____________________________________________   LABS (all labs ordered are listed, but only abnormal results are displayed)  Labs Reviewed  CBC WITH DIFFERENTIAL/PLATELET - Abnormal; Notable for the following components:      Result Value   RDW 15.9 (*)    Platelets 495 (*)    Neutro Abs 6.8 (*)    All other components within normal limits  CULTURE, BLOOD (ROUTINE X  2)  CULTURE, BLOOD (ROUTINE X 2)  COMPREHENSIVE METABOLIC PANEL  URINALYSIS, ROUTINE W REFLEX MICROSCOPIC  LACTIC ACID, PLASMA  LACTIC ACID, PLASMA   ____________________________________________  EKG  ED ECG REPORT I, [place] Kotarski, the attending physician, personally viewed and interpreted this ECG.   Date: 01/16/2017  EKG Time: 6:41 AM  Rate: 135  Rhythm: Sinus tachycardia  Axis: Normal  Intervals: Normal  ST&T Change: None ____________________  PROCEDURES  Critical Care  performed: CRITICAL CARE Performed by: Gregor Hams   Total critical care time: 40 minutes  Critical care time was exclusive of separately billable procedures and treating other patients.  Critical care was necessary to treat or prevent imminent or life-threatening deterioration.  Critical care was time spent personally by me on the following activities: development of treatment plan with patient and/or surrogate as well as nursing, discussions with consultants, evaluation of patient's response to treatment, examination of patient, obtaining history from patient or surrogate, ordering and performing treatments and interventions, ordering and review of laboratory studies, ordering and review of radiographic studies, pulse oximetry and re-evaluation of patient's condition.   Procedures   ____________________________________________   INITIAL IMPRESSION / ASSESSMENT AND PLAN / ED COURSE  As part of my medical decision making, I reviewed the following data within the electronic MEDICAL RECORD [id number] year old male presented to the emergency department with above-stated history and physical exam of a clogged G-tube however patient noted to be markedly tachycardic tachypneic on arrival such concern for possible sepsis especially given history of recent urinary tract infection.  As such sepsis protocol was initiated patient's G-tube is indeed clogged and unable to irrigate at this time. ____________________________________________  FINAL CLINICAL IMPRESSION(S) / ED DIAGNOSES  Final diagnoses:  Sepsis, due to unspecified organism ([place])  Malfunction of gastrostomy tube ([place])     MEDICATIONS GIVEN DURING THIS VISIT:  Medications  cefTRIAXone (ROCEPHIN) 2 g in dextrose 5 % 50 mL IVPB (not administered)     ED Discharge Orders    None       Note:  This document was prepared using Dragon voice recognition software and may include unintentional dictation errors.    Gregor Hams, MD 01/16/17 [id number]

## 2017-01-16 NOTE — ED Notes (Signed)
Resumed care from [place], [place]. Pt resting

## 2017-01-16 NOTE — ED Notes (Signed)
Feeding tube changed by Dr. Jimmye Norman

## 2017-01-16 NOTE — ED Triage Notes (Signed)
Coming from [facility] for clogged feeding tube.

## 2017-01-17 LAB — BASIC METABOLIC PANEL
ANION GAP: 9 (ref 5–15)
BUN: 21 mg/dL — AB (ref 6–20)
CHLORIDE: 103 mmol/L (ref 101–111)
CO2: 28 mmol/L (ref 22–32)
Calcium: 9.3 mg/dL (ref 8.9–10.3)
Creatinine, Ser: 0.92 mg/dL (ref 0.61–1.24)
GFR calc Af Amer: >60 mL/min (ref >60)
GFR calc non Af Amer: >60 mL/min (ref >60)
Glucose, Bld: 109 mg/dL — ABNORMAL HIGH (ref 65–99)
POTASSIUM: 4.3 mmol/L (ref 3.5–5.1)
Sodium: 140 mmol/L (ref 135–145)

## 2017-01-17 LAB — CBC
HEMATOCRIT: 40.4 % (ref 40.0–52.0)
Hemoglobin: 13.4 g/dL (ref 13.0–18.0)
MCH: 31.4 pg (ref 26.0–34.0)
MCHC: 33.1 g/dL (ref 32.0–36.0)
MCV: 94.7 fL (ref 80.0–100.0)
Platelets: 424 10*3/uL (ref 150–440)
RBC: 4.27 MIL/uL — AB (ref 4.40–5.90)
RDW: 16 % — ABNORMAL HIGH (ref 11.5–14.5)
WBC: 7.3 10*3/uL (ref 3.8–10.6)

## 2017-01-17 LAB — PROTIME-INR
INR: 1.9
Prothrombin Time: 21.6 seconds — ABNORMAL HIGH (ref 11.4–15.2)

## 2017-01-17 MED ORDER — WARFARIN SODIUM 7.5 MG PO TABS
7.5000 mg | ORAL_TABLET | Freq: Once | ORAL | Status: AC
  Administered 2017-01-17: 17:00:00 7.5 mg
  Filled 2017-01-17: qty 1

## 2017-01-17 MED ORDER — WARFARIN SODIUM 7.5 MG PO TABS: 7.5000 mg | ORAL_TABLET | Freq: Once | ORAL | Status: DC

## 2017-01-17 MED ORDER — DILTIAZEM HCL 30 MG PO TABS
60.0000 mg | ORAL_TABLET | Freq: Three times a day (TID) | ORAL | Status: DC
  Administered 2017-01-17 – 2017-01-22 (×16): 60 mg via ORAL
  Filled 2017-01-17 (×16): qty 2

## 2017-01-17 MED ORDER — WARFARIN - PHARMACIST DOSING INPATIENT
Freq: Every day | Status: DC
  Administered 2017-01-17 – 2017-01-20 (×4)

## 2017-01-17 NOTE — Evaluation (Signed)
Physical Therapy Evaluation Patient Details Name: Anthony Jackson MRN: [id number] DOB: [birth year] Today's Date: 01/17/2017   History of Present Illness   67 y.o. male with a known history of brain tumor status post resection in October 2018 at [facility] had a stroke during resection.  Pt has been at [facility] X 2 days after second [facility] admission.    Clinical Impression  Pt has been quite limited functionally since October but apparently has stood with heavy assist at [facility] 1 or 2 times.  He was lethargic but eager to work with PT today and showed great effort as he was able.  Pt inconsistent with R U&LE engagement and had little against gravity strength, he did better and was much more functional on the L.  Pt again showed great effort, but fell asleep multiple times during exam and ~20 minutes of exercises (+ mobility, sitting balance).  Pt will require continued STR once medically able to leave.     Follow Up Recommendations SNF    Equipment Recommendations       Recommendations for Other Services       Precautions / Restrictions Precautions Precautions: Fall Restrictions Weight Bearing Restrictions: No      Mobility  Bed Mobility Overal bed mobility: Needs Assistance Bed Mobility: Supine to Sit;Sit to Supine     Supine to sit: Mod assist;Max assist Sit to supine: Max assist   General bed mobility comments: Pt made great effort with getting to EOB.  He was able to use L UE to hold/pull on bed rail, ultimately needed heavy assist to get trunk to fully upright.  Pt leaning heavily to the R in sitting, when holding with L UE he could maintain sitting but did not truly have core strength to maintain "balance"  Transfers                 General transfer comment: not safe/appropriate today  Ambulation/Gait                Stairs            Wheelchair Mobility    Modified Rankin (Stroke Patients Only)       Balance Overall balance assessment: Needs  assistance Sitting-balance support: Bilateral upper extremity supported;Feet supported Sitting balance-Leahy Scale: Zero Sitting balance - Comments: When holding bed handle with L rail he could briefly maintain CGA sitting balance, but leaning/falling heavily to the R t/o time sitting                                     Pertinent Vitals/Pain Pain Assessment: No/denies pain    Home Living Family/patient expects to be discharged to:: Skilled nursing facility                      Prior Function Level of Independence: Needs assistance         Comments: since 10/18 pt has been able to participate with PT but has functionally been very limited     Hand Dominance   Dominant Hand: Left    Extremity/Trunk Assessment   Upper Extremity Assessment Upper Extremity Assessment: Generalized weakness(R UE grossly 2-/5, L UE 3-/5)    Lower Extremity Assessment Lower Extremity Assessment: Generalized weakness(R LE grossly 2+/5, L LE grossly 3/5)       Communication   Communication: (slurred, slow, labored speech)  Cognition Arousal/Alertness: Lethargic Behavior During Therapy: [facility] for  tasks assessed/performed(pt willing to participate, falling asleep occasionally) Overall Cognitive Status: Difficult to assess                                 General Comments: Pt had change in status after resection/CVA 10/18, near his new baseline but lethargic today      General Comments      Exercises General Exercises - Lower Extremity Ankle Circles/Pumps: Both;AROM;AAROM;10 reps(AAROM on R) Quad Sets: Strengthening;AROM;10 reps;Both Gluteal Sets: AROM;10 reps;Both Short Arc Quad: AROM;AAROM;10 reps(AAROM on R) Heel Slides: AROM;Strengthening;10 reps;Both Hip ABduction/ADduction: Strengthening;AROM;10 reps;Both Straight Leg Raises: AAROM;5 reps;Both   Assessment/Plan    PT Assessment Patient needs continued PT services  PT Problem List Decreased  strength;Decreased range of motion;Decreased activity tolerance;Decreased balance;Decreased mobility;Decreased coordination;Decreased cognition;Decreased knowledge of use of DME;Decreased safety awareness       PT Treatment Interventions DME instruction;Gait training;Stair training;Functional mobility training;Therapeutic activities;Therapeutic exercise;Balance training;Neuromuscular re-education;Cognitive remediation;Patient/family education;Wheelchair mobility training    PT Goals (Current goals can be found in the Care Plan section)  Acute Rehab PT Goals Patient Stated Goal: go back to rehab PT Goal Formulation: With patient/family Time For Goal Achievement: 01/31/17 Potential to Achieve Goals: Fair    Frequency Min 2X/week   Barriers to discharge        Co-evaluation               AM-PAC PT "6 Clicks" Daily Activity  Outcome Measure Difficulty turning over in bed (including adjusting bedclothes, sheets and blankets)?: Unable Difficulty moving from lying on back to sitting on the side of the bed? : Unable Difficulty sitting down on and standing up from a chair with arms (e.g., wheelchair, bedside commode, etc,.)?: Unable Help needed moving to and from a bed to chair (including a wheelchair)?: Total Help needed walking in hospital room?: Total Help needed climbing 3-5 steps with a railing? : Total 6 Click Score: 6    End of Session Equipment Utilized During Treatment: Gait belt Activity Tolerance: Patient limited by fatigue Patient left: with bed alarm set;with call bell/phone within reach Nurse Communication: Mobility status;Other (comment)(need for clean up) PT Visit Diagnosis: Muscle weakness (generalized) (M62.81);Difficulty in walking, not elsewhere classified (R26.2)    Time: 5625-6389 PT Time Calculation (min) (ACUTE ONLY): 32 min   Charges:   PT Evaluation $PT Eval Low Complexity: 1 Low PT Treatments $Therapeutic Exercise: 8-22 mins   PT G Codes:         Kreg Shropshire, DPT 01/17/2017, 12:54 PM

## 2017-01-17 NOTE — Progress Notes (Signed)
[place] at [place] NAME: Anthony Jackson    MR#:  [id number]  DATE OF BIRTH:  14-Oct-1950  SUBJECTIVE:  CHIEF COMPLAINT:  No chief complaint on file. About same. No new issues.  REVIEW OF SYSTEMS:  Review of Systems  Unable to perform ROS: Patient nonverbal   DRUG ALLERGIES:  Not on File VITALS:  Blood pressure 114/85, pulse 97, temperature 97.7 F (36.5 C), temperature source Oral, resp. rate 18, height 5\' 3"  (1.6 m), weight 85.6 kg (188 lb 11.2 oz), SpO2 98 %. PHYSICAL EXAMINATION:  Physical Exam  Constitutional: He is well-developed, well-nourished, and in no distress.  HENT:  Head: Normocephalic and atraumatic.  Eyes: Conjunctivae and EOM are normal. Pupils are equal, round, and reactive to light.  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 Neck supple. No tracheal deviation present. No thyromegaly present.  Cardiovascular: Normal rate, regular rhythm and normal heart sounds.  Pulmonary/Chest: Effort normal and breath sounds normal. No respiratory distress. He has no wheezes. He exhibits no tenderness.  Abdominal: Soft. Bowel sounds are normal. He exhibits no distension. There is no tenderness.  Musculoskeletal: Normal range of motion.  Neurological: No cranial nerve deficit.  Non-verbal and sleepy  Skin: Skin is warm and dry. No rash noted.  Psychiatric:  Non-verbal and sleepy   LABORATORY PANEL:  Male CBC Recent Labs  Lab 01/17/17 0603  WBC 7.3  HGB 13.4  HCT 40.4  PLT 424   ------------------------------------------------------------------------------------------------------------------ Chemistries  Recent Labs  Lab 01/16/17 0640 01/17/17 0603  NA 134* 140  K 4.7 4.3  CL 96* 103  CO2 26 28  GLUCOSE 118* 109*  BUN 16 21*  CREATININE 0.78 0.92  CALCIUM 9.9 9.3  AST 54*  --   ALT 86*  --   ALKPHOS 247*  --   BILITOT 0.6  --    RADIOLOGY:  US Venous Img Upper Uni Right  Result  Date: 01/16/2017 CLINICAL DATA:  Right upper extremity swelling. EXAM: RIGHT UPPER EXTREMITY VENOUS DOPPLER ULTRASOUND TECHNIQUE: Gray-scale sonography with graded compression, as well as color Doppler and duplex ultrasound were performed to evaluate the upper extremity deep venous system from the level of the subclavian vein and including the jugular, axillary, basilic, radial, ulnar and upper cephalic vein. Spectral Doppler was utilized to evaluate flow at rest. Augmentation was not performed. COMPARISON:  None. FINDINGS: Contralateral Subclavian Vein: Color Doppler flow and normal phasicity. Internal Jugular Vein: No evidence of thrombus. Normal compressibility, color Doppler flow and phasicity. Subclavian Vein: No evidence of thrombus. Normal color Doppler flow, compressibility and phasicity. Axillary Vein: No evidence of thrombus. Normal compressibility and color Doppler flow. Augmentation was not performed. Cephalic Vein: Right cephalic vein is small but noncompressible. Findings are suggestive for thrombosis and likely chronic. Basilic Vein: Right basilic vein is small but noncompressible. Findings are suggestive for thrombus and likely chronic. Brachial Veins: No evidence of thrombus. Normal compressibility with color Doppler flow. Radial Veins: No evidence of thrombus. Normal compressibility and color Doppler flow. Ulnar Veins: No evidence of thrombus. Normal compressibility and color Doppler flow. Other Findings:  None visualized. IMPRESSION: No evidence of deep venous thrombosis within the right upper extremity. Positive for superficial venous thrombosis in the right cephalic vein and right basilic vein. 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 Electronically Signed   By: Markus Daft M.D.   On: 01/16/2017 17:48   ASSESSMENT AND  PLAN:  Bynum Mccullars  is a 67 y.o. male with a known history of brain tumor status post resection in October 2018 at [facility] had a stroke during  resection,, essential hypertension is sent over from [place] to emergency department with a chief complaint of clogged G-tube patient was treated with antibiotics at [place] for possible UTI.  G-tube was replaced in the emergency department but patient was tachycardic and has low-grade fever and elevated lactic acid.  #Sepsis-likely due to urinary source - present on admission - continue Broad-spectrum IV antibiotics for now. - await blood c/s - urine growing e.facecium  * UTI: e. Faecium growing in urine c/s - continue Abx  #Mechanical obstruction of the PEG tube PEG tube was replaced in the emergency department and functioning well now Resume feeds and monitor closely  #History of CVA during brain tumor resection Patient is on Coumadin. INR is 1.9 Coumadin management by pharmacy  #History of brain tumor status post resection in October 2018  at [place] follow-up with [facility] neurosurgery as recommended No interventions needed at this time  #History of seizures Continue Keppra and Dilantin     Likely D/C back to [facility] once stable   All the records are reviewed and case discussed with Care Management/Social Worker. Management plans discussed with the patient, family (wife at bedside) and they are in agreement.  CODE STATUS: Full Code  TOTAL TIME TAKING CARE OF THIS PATIENT: 35 minutes.   More than 50% of the time was spent in counseling/coordination of care: YES  POSSIBLE D/C IN 1-2 DAYS, DEPENDING ON CLINICAL CONDITION.   Max Sane M.D on 01/17/2017 at 4:14 PM  Between 7am to 6pm - Pager - [phone]  After 6pm go to [email or web address] - Proofreader  [employer] [place] [facility]  Office  [phone]  CC: Primary care physician; System, Provider Not In  Note: This dictation was prepared with Dragon dictation along with smaller phrase technology. Any transcriptional errors that result from this process are  unintentional.

## 2017-01-17 NOTE — Progress Notes (Signed)
Clinical Education officer, museum (CSW) started [employer] [facility] authorization through [employer] today. Patient can return to [facility] once authorization is received.   [employer], LCSW [phone]

## 2017-01-17 NOTE — Progress Notes (Signed)
ANTICOAGULATION CONSULT NOTE - Initial Consult  Pharmacy Consult for warfarin Indication: CVA  Not on File  Patient Measurements: Height: 5\' 3"  (160 cm) Weight: 188 lb 11.2 oz (85.6 kg) IBW/kg (Calculated) : 56.9  Vital Signs: Temp: 99 F (37.2 C) (01/10 0441) Temp Source: Oral (01/10 0441) BP: 140/106 (01/10 0908) Pulse Rate: 122 (01/10 0908)  Labs: Recent Labs    01/16/17 0640 01/16/17 1117 01/17/17 0603  HGB 15.1  --  13.4  HCT 46.0  --  40.4  PLT 495*  --  424  LABPROT  --  21.7* 21.6*  INR  --  1.91 1.90  CREATININE 0.78  --  0.92    Estimated Creatinine Clearance: 74.3 mL/min (by C-G formula based on SCr of 0.92 mg/dL).   Medical History: Past Medical History:  Diagnosis Date  . Hypertension    Assessment: Pharmacy consulted to dose and monitor warfarin in this 67 year old male who was on warfarin prior to admission for a CVA.  INR = 1.9 is only slightly subtherapeutic on admission.  Home dose = warfarin 5 mg per tube daily  Of note, patient is receiving phenytoin but is a PTA medication.  Dosing history: Date INR Dose 1/10 1.9 None charted, last dose per med rec 1/8 1/11 1.9  Goal of Therapy:  INR 2-3 Monitor platelets by anticoagulation protocol: Yes   Plan:  INR = 1.9 is only slightly subtherapeutic and close to goal. However, given that no dose was documented yesterday and INR is subtherapeutic, will give warfarin 7.5 mg PO this evening. Will recheck INR with AM labs tomorrow.  Lenis Noon, PharmD, BCPS Clinical Pharmacist 01/17/2017,10:36 AM

## 2017-01-18 LAB — CBC
HEMATOCRIT: 39.8 % — AB (ref 40.0–52.0)
Hemoglobin: 12.8 g/dL — ABNORMAL LOW (ref 13.0–18.0)
MCH: 31 pg (ref 26.0–34.0)
MCHC: 32.2 g/dL (ref 32.0–36.0)
MCV: 96.2 fL (ref 80.0–100.0)
Platelets: 309 10*3/uL (ref 150–440)
RBC: 4.13 MIL/uL — AB (ref 4.40–5.90)
RDW: 16.2 % — ABNORMAL HIGH (ref 11.5–14.5)
WBC: 6 10*3/uL (ref 3.8–10.6)

## 2017-01-18 LAB — URINE CULTURE
Culture: >=100000 — AB
SPECIAL REQUESTS: NORMAL

## 2017-01-18 LAB — BASIC METABOLIC PANEL
ANION GAP: 11 (ref 5–15)
BUN: 18 mg/dL (ref 6–20)
CALCIUM: 9.2 mg/dL (ref 8.9–10.3)
CHLORIDE: 102 mmol/L (ref 101–111)
CO2: 24 mmol/L (ref 22–32)
Creatinine, Ser: 0.76 mg/dL (ref 0.61–1.24)
GFR calc non Af Amer: >60 mL/min (ref >60)
Glucose, Bld: 120 mg/dL — ABNORMAL HIGH (ref 65–99)
POTASSIUM: 4 mmol/L (ref 3.5–5.1)
Sodium: 137 mmol/L (ref 135–145)

## 2017-01-18 LAB — PROTIME-INR
INR: 1.67
Prothrombin Time: 19.6 seconds — ABNORMAL HIGH (ref 11.4–15.2)

## 2017-01-18 LAB — LEVETIRACETAM LEVEL: Levetiracetam Lvl: 23.5 ug/mL (ref 10.0–40.0)

## 2017-01-18 MED ORDER — WARFARIN SODIUM 7.5 MG PO TABS
7.5000 mg | ORAL_TABLET | Freq: Once | ORAL | Status: AC
  Administered 2017-01-18: 7.5 mg via ORAL
  Filled 2017-01-18: qty 1

## 2017-01-18 MED ORDER — LINEZOLID 600 MG/300ML IV SOLN
600.0000 mg | Freq: Two times a day (BID) | INTRAVENOUS | Status: DC
  Administered 2017-01-18 – 2017-01-21 (×7): 600 mg via INTRAVENOUS
  Filled 2017-01-18 (×8): qty 300

## 2017-01-18 NOTE — Care Management Important Message (Signed)
Important Message  Patient Details  Name: Anthony Jackson MRN: [id number] Date of Birth: [birth year]   Medicare Important Message Given:  Yes    Shelbie Ammons, RN 01/18/2017, 7:14 AM

## 2017-01-18 NOTE — Progress Notes (Signed)
Clinical Education officer, museum (CSW) received a call from [employer] stating that patient's wife wants him to go to a facility in [facility] near her however patient's adult children live in [place] and want patient to return to [facility] because it is half way in between. CSW made Broad[facility] John aware that patient will return to [facility] and family will have to work this out themselves. CSW attempted to meet with patient's wife however she was not at bedside and did not answer the phone.  Plan is for patient to D/C back to [facility] pending [facility]F authorization. MD aware of above.   [employer], LCSW [phone]

## 2017-01-18 NOTE — Progress Notes (Signed)
[place] at [place] NAME: Anthony Jackson    MR#:  [id number]  DATE OF BIRTH:  Jan 02, 1951  SUBJECTIVE:  CHIEF COMPLAINT:  No chief complaint on file. mumbling some words REVIEW OF SYSTEMS:  Review of Systems  Unable to perform ROS: Patient nonverbal   DRUG ALLERGIES:  Not on File VITALS:  Blood pressure 114/65, pulse (!) 103, temperature 97.8 F (36.6 C), temperature source Oral, resp. rate 17, height 5\' 3"  (1.6 m), weight 85.6 kg (188 lb 11.2 oz), SpO2 96 %. PHYSICAL EXAMINATION:  Physical Exam  Constitutional: He is well-developed, well-nourished, and in no distress.  HENT:  Head: Normocephalic and atraumatic.  Eyes: Conjunctivae and EOM are normal. Pupils are equal, round, and reactive to light.  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 Neck supple. No tracheal deviation present. No thyromegaly present.  Cardiovascular: Normal rate, regular rhythm and normal heart sounds.  Pulmonary/Chest: Effort normal and breath sounds normal. No respiratory distress. He has no wheezes. He exhibits no tenderness.  Abdominal: Soft. Bowel sounds are normal. He exhibits no distension. There is no tenderness.  Musculoskeletal: Normal range of motion.  Neurological: No cranial nerve deficit.  Non-verbal and sleepy  Skin: Skin is warm and dry. No rash noted.  Psychiatric:  Non-verbal and sleepy   LABORATORY PANEL:  Male CBC Recent Labs  Lab 01/18/17 0641  WBC 6.0  HGB 12.8*  HCT 39.8*  PLT 309   ------------------------------------------------------------------------------------------------------------------ Chemistries  Recent Labs  Lab 01/16/17 0640  01/18/17 0641  NA 134*   < > 137  K 4.7   < > 4.0  CL 96*   < > 102  CO2 26   < > 24  GLUCOSE 118*   < > 120*  BUN 16   < > 18  CREATININE 0.78   < > 0.76  CALCIUM 9.9   < > 9.2  AST 54*  --   --   ALT 86*  --   --   ALKPHOS 247*  --   --   BILITOT 0.6  --    --    < > = values in this interval not displayed.   RADIOLOGY:  No results found. ASSESSMENT AND PLAN:  Anthony Jackson  is a 67 y.o. male with a known history of brain tumor status post resection in October 2018 at [facility] had a stroke during resection,, essential hypertension is sent over from [place] to emergency department with a chief complaint of clogged G-tube patient was treated with antibiotics at [place] for possible UTI.  G-tube was replaced in the emergency department but patient was tachycardic and has low-grade fever and elevated lactic acid.  #Sepsis-likely due to urinary source - present on admission - change Abx to Zyvox - await blood c/s - urine growing VRE  * VRE UTI: urine c/s growing VRE - change Abx to Zyvox  #Mechanical obstruction of the PEG tube PEG tube was replaced in the emergency department and functioning well now Resume feeds and monitor closely  #History of CVA during brain tumor resection Patient is on Coumadin. INR is 1.67 Coumadin management by pharmacy  #History of brain tumor status post resection in October 2018  at [place] follow-up with [facility] neurosurgery as recommended No interventions needed at this time  #History of seizures Continue Keppra and Dilantin     Likely D/C back to [facility] once stable and [employer]  back   All the records are reviewed and case discussed with Care Management/Social Worker. Management plans discussed with the patient, family (wife at bedside) and they are in agreement.  CODE STATUS: Full Code  TOTAL TIME TAKING CARE OF THIS PATIENT: 35 minutes.   More than 50% of the time was spent in counseling/coordination of care: YES  POSSIBLE D/C IN 1-2 DAYS, DEPENDING ON CLINICAL CONDITION.   Max Sane M.D on 01/18/2017 at 6:49 PM  Between 7am to 6pm - Pager - [phone]  After 6pm go to [email or web address] - Proofreader  [facility] [place]  Office   [phone]  CC: Primary care physician; System, Provider Not In  Note: This dictation was prepared with Dragon dictation along with smaller phrase technology. Any transcriptional errors that result from this process are unintentional.

## 2017-01-18 NOTE — Progress Notes (Signed)
Clinical Education officer, museum (CSW) received a call from [place] case manager stating that patient is not stable for D/C per notes and authorization will have to be re-started on Monday.   [employer], LCSW [phone]

## 2017-01-18 NOTE — Progress Notes (Signed)
OT Cancellation Note  Patient Details Name: Anthony Jackson MRN: [id number] DOB: Apr 08, 1950   Cancelled Treatment:    Reason Eval/Treat Not Completed: Patient at procedure or test/ unavailable Nursing performing pt. care with pt. Will attempt at a later date, or time.  Harrel Carina, MS, OTR/L 01/18/2017, 2:35 PM

## 2017-01-18 NOTE — Progress Notes (Signed)
Occupational Therapy Treatment Patient Details Name: Anthony Jackson MRN: [id number] DOB: Jun 23, 1950 Today's Date: 01/18/2017    History of present illness  Pt. is a 68 y.o. male with a known history of brain tumor status post resection in October 2018 at [facility]. Pt. had a stroke during the resection.  Pt has had multiple admissions to [facility] since then, and has been at [facility] prior to this admission.     OT comments  Pt. Presents with lethargy, impaired RUE functioning, weakness, and limited mobility which limit his ability to perform ADLs, and IADLs. Pt. Was staying at [facility] for STR prior to this admission. No family is present. Pt. education was provided about positioning, and ROM, light one step ADL tasks. Pt. was lethargic during the session, and presented with limited ability to follow commands, and perform tasks. Pt. could benefit from OT services for ADL training, neuromuscular re-education, positioning, cognitive compensatory strategies, visual/perceptual functioning, and pt./ family education. Pt. Would benefit from SNF level of care upon discharge with follow-up OT services. Pt. Plans to return to [place].     Follow Up Recommendations  SNF    Equipment Recommendations       Recommendations for Other Services      Precautions / Restrictions                ADL either performed or assessed with clinical judgement   ADL   Eating/Feeding: Total assistance   Grooming: Total assistance   Upper Body Bathing: Total assistance   Lower Body Bathing: Total assistance   Upper Body Dressing : Total assistance   Lower Body Dressing: Total assistance   Toilet Transfer: Total assistance           Functional mobility during ADLs: Total assistance       Vision  To be assessed in functional context.     Perception     Praxis      Cognition Arousal/Alertness: Lethargic                                              Exercises      Shoulder Instructions       General Comments      Pertinent Vitals/ Pain       Pain Assessment: (Unable to rate)Unable to rate  Home Living Family/patient expects to be discharged to:: Skilled nursing facility                                        Prior Functioning/Environment Level of Independence: Needs assistance            Frequency  Min 1X/week        Progress Toward Goals  OT Goals(current goals can now be found in the care plan section)     Acute Rehab OT Goals Patient Stated Goal: To return to rehab OT Goal Formulation: With patient Potential to Achieve Goals: Good  Plan      Co-evaluation                 AM-PAC PT "6 Clicks" Daily Activity     Outcome Measure   Help from another person eating meals?: Total Help from another person taking care of personal grooming?: Total Help from another person toileting, which  includes using toliet, bedpan, or urinal?: Total Help from another person bathing (including washing, rinsing, drying)?: Total Help from another person to put on and taking off regular upper body clothing?: Total Help from another person to put on and taking off regular lower body clothing?: Total 6 Click Score: 6    End of Session    OT Visit Diagnosis: Unsteadiness on feet (R26.81)   Activity Tolerance Patient limited by lethargy   Patient Left in bed   Nurse Communication          Time: 6979-4801 OT Time Calculation (min): 15 min  Charges: OT General Charges $OT Visit: 1 Visit OT Evaluation $OT Eval Low Complexity: 1 Low  Harrel Carina, MS, OTR/L    Harrel Carina, MS, OTR/L 01/18/2017, 4:41 PM

## 2017-01-18 NOTE — Progress Notes (Addendum)
ANTICOAGULATION CONSULT NOTE - Initial Consult  Pharmacy Consult for warfarin Indication: CVA  Not on File  Patient Measurements: Height: 5\' 3"  (160 cm) Weight: 188 lb 11.2 oz (85.6 kg) IBW/kg (Calculated) : 56.9  Vital Signs: Temp: 97.8 F (36.6 C) (01/11 0604) Temp Source: Oral (01/11 0604) BP: 114/65 (01/11 0604) Pulse Rate: 103 (01/11 0604)  Labs: Recent Labs    01/16/17 0640 01/16/17 1117 01/17/17 0603 01/18/17 0641  HGB 15.1  --  13.4 12.8*  HCT 46.0  --  40.4 39.8*  PLT 495*  --  424 309  LABPROT  --  21.7* 21.6* 19.6*  INR  --  1.91 1.90 1.67  CREATININE 0.78  --  0.92 0.76    Estimated Creatinine Clearance: 85.5 mL/min (by C-G formula based on SCr of 0.76 mg/dL).   Medical History: Past Medical History:  Diagnosis Date  . Hypertension    Assessment: Pharmacy consulted to dose and monitor warfarin in this 67 year old male who was on warfarin prior to admission for a CVA.  INR = 1.9 is only slightly subtherapeutic on admission.  Home dose = warfarin 5 mg per tube daily  Of note, patient is receiving phenytoin but is a PTA medication.  Dosing history: Date INR Dose 1/9 1.9 None charted, last dose per med rec 1/8 1/10 1.9 7.5 mg  1/11 1.7  Goal of Therapy:  INR 2-3 Monitor platelets by anticoagulation protocol: Yes   Plan:  INR = 1.7 is subtherapeutic, likely a reflection of missed dose on 1/9. Will give another warfarin 7.5 mg PO this evening. Will recheck INR with AM labs tomorrow.  Lenis Noon, PharmD, BCPS Clinical Pharmacist 01/18/2017,2:37 PM

## 2017-01-18 NOTE — Progress Notes (Signed)
Pharmacy Antibiotic Note  Anthony Jackson is a 67 y.o. male admitted on 01/16/2017 with VRE UTI.  Pharmacy has been consulted for linezolid dosing.  Plan: Order linezolid 600 mg IV q12h Patient is already on contact precautions  Height: 5\' 3"  (160 cm) Weight: 188 lb 11.2 oz (85.6 kg) IBW/kg (Calculated) : 56.9  Temp (24hrs), Avg:97.9 F (36.6 C), Min:97.7 F (36.5 C), Max:98.3 F (36.8 C)  Recent Labs  Lab 01/16/17 0640 01/16/17 1117 01/16/17 1312 01/16/17 1502 01/17/17 0603 01/18/17 0641  WBC 9.2  --   --   --  7.3 6.0  CREATININE 0.78  --   --   --  0.92 0.76  LATICACIDVEN 2.1* 1.9 1.7 1.9  --   --     Estimated Creatinine Clearance: 85.5 mL/min (by C-G formula based on SCr of 0.76 mg/dL).    Not on File  Antimicrobials this admission: linezolid 1/11 >>  vancomycin 1/9 >> 1/11 Pip/tazo 1/9 >>   Dose adjustments this admission:  Microbiology results: 1/9 BCx: No growth 2 days 1/9 UCx: VRE  1/9 MRSA PCR: Negative  Thank you for allowing pharmacy to be a part of this patient's care.  Lenis Noon, PharmD, BCPS Clinical Pharmacist 01/18/2017 10:13 AM

## 2017-01-19 ENCOUNTER — Inpatient Hospital Stay: Payer: Medicare PPO

## 2017-01-19 LAB — BASIC METABOLIC PANEL
ANION GAP: 9 (ref 5–15)
BUN: 16 mg/dL (ref 6–20)
CALCIUM: 9.1 mg/dL (ref 8.9–10.3)
CO2: 27 mmol/L (ref 22–32)
Chloride: 101 mmol/L (ref 101–111)
Creatinine, Ser: 0.71 mg/dL (ref 0.61–1.24)
Glucose, Bld: 115 mg/dL — ABNORMAL HIGH (ref 65–99)
POTASSIUM: 3.8 mmol/L (ref 3.5–5.1)
Sodium: 137 mmol/L (ref 135–145)

## 2017-01-19 LAB — CBC
HCT: 37.6 % — ABNORMAL LOW (ref 40.0–52.0)
Hemoglobin: 12.7 g/dL — ABNORMAL LOW (ref 13.0–18.0)
MCH: 32 pg (ref 26.0–34.0)
MCHC: 33.9 g/dL (ref 32.0–36.0)
MCV: 94.3 fL (ref 80.0–100.0)
Platelets: 434 10*3/uL (ref 150–440)
RBC: 3.98 MIL/uL — AB (ref 4.40–5.90)
RDW: 15.9 % — AB (ref 11.5–14.5)
WBC: 5.5 10*3/uL (ref 3.8–10.6)

## 2017-01-19 LAB — PROTIME-INR
INR: 2.09
Prothrombin Time: 23.3 seconds — ABNORMAL HIGH (ref 11.4–15.2)

## 2017-01-19 MED ORDER — WARFARIN SODIUM 5 MG PO TABS
5.0000 mg | ORAL_TABLET | Freq: Once | ORAL | Status: AC
  Administered 2017-01-19: 5 mg via ORAL
  Filled 2017-01-19: qty 1

## 2017-01-19 NOTE — Progress Notes (Signed)
[place] at [place] NAME: Isidore Margraf    MR#:  [id number]  DATE OF BIRTH:  [birth year]  SUBJECTIVE:  CHIEF COMPLAINT:  No chief complaint on file. mumbling some words, COUGHING  REVIEW OF SYSTEMS:  Review of Systems  Unable to perform ROS: Patient nonverbal   DRUG ALLERGIES:  Not on File VITALS:  Blood pressure 131/88, pulse 99, temperature 97.6 F (36.4 C), temperature source Oral, resp. rate 20, height 5\' 3"  (1.6 m), weight 85.6 kg (188 lb 11.2 oz), SpO2 99 %. PHYSICAL EXAMINATION:  Physical Exam  Constitutional: He is well-developed, well-nourished, and in no distress.  HENT:  Head: Normocephalic and atraumatic.  Eyes: Conjunctivae and EOM are normal. Pupils are equal, round, and reactive to light.  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 Neck supple. No tracheal deviation present. No thyromegaly present.  Cardiovascular: Normal rate, regular rhythm and normal heart sounds.  Pulmonary/Chest: Effort normal and breath sounds normal. No respiratory distress. He has no wheezes. He exhibits no tenderness.  Abdominal: Soft. Bowel sounds are normal. He exhibits no distension. There is no tenderness.  Musculoskeletal: Normal range of motion.  Neurological: No cranial nerve deficit.  Non-verbal and sleepy  Skin: Skin is warm and dry. No rash noted.  Psychiatric:  Non-verbal and sleepy   LABORATORY PANEL:  Male CBC Recent Labs  Lab 01/19/17 0416  WBC 5.5  HGB 12.7*  HCT 37.6*  PLT 434   ------------------------------------------------------------------------------------------------------------------ Chemistries  Recent Labs  Lab 01/16/17 0640  01/19/17 0416  NA 134*   < > 137  K 4.7   < > 3.8  CL 96*   < > 101  CO2 26   < > 27  GLUCOSE 118*   < > 115*  BUN 16   < > 16  CREATININE 0.78   < > 0.71  CALCIUM 9.9   < > 9.1  AST 54*  --   --   ALT 86*  --   --   ALKPHOS 247*  --   --   BILITOT  0.6  --   --    < > = values in this interval not displayed.   RADIOLOGY:  No results found. ASSESSMENT AND PLAN:  Gurshan Settlemire  is a 67 y.o. male with a known history of brain tumor status post resection in October 2018 at [facility] had a stroke during resection,, essential hypertension is sent over from [place] to emergency department with a chief complaint of clogged G-tube patient was treated with antibiotics at [place] for possible UTI.  G-tube was replaced in the emergency department but patient was tachycardic and has low-grade fever and elevated lactic acid.  #Sepsis-likely due to urinary source - present on admission - change Abx to Zyvox - await blood c/s - urine growing VRE  #Cough .  Chest x-ray to rule out developing pneumonia  * VRE UTI: urine c/s growing VRE - change Abx to Zyvox  #Mechanical obstruction of the PEG tube PEG tube was replaced in the emergency department and functioning well now Resume feeds and monitor closely  #History of CVA during brain tumor resection Patient is on Coumadin. INR is 1.67 Coumadin management by pharmacy  #History of brain tumor status post resection in October 2018  at [place] follow-up with [facility] neurosurgery as recommended No interventions needed at this time Will consider repeat CT of the  head if no clinical  improvement and if patient is still lethargic  #History of seizures Continue Keppra and Dilantin     Likely D/C back to [facility] once stable and Insurance Auth back   All the records are reviewed and case discussed with Care Management/Social Worker. Management plans discussed with the patient, family (wife at bedside) and they are in agreement.  CODE STATUS: Full Code  TOTAL TIME TAKING CARE OF THIS PATIENT: 35 minutes.   More than 50% of the time was spent in counseling/coordination of care: YES  POSSIBLE D/C IN 1-2 DAYS, DEPENDING ON CLINICAL CONDITION.   Nicholes Mango M.D on  01/19/2017 at 4:32 PM  Between 7am to 6pm - Pager - [phone]   After 6pm go to [email or web address] - Proofreader  [facility] [place] [facility]  Office  [phone]  CC: Primary care physician; System, Provider Not In  Note: This dictation was prepared with Dragon dictation along with smaller phrase technology. Any transcriptional errors that result from this process are unintentional.

## 2017-01-19 NOTE — Progress Notes (Signed)
ANTICOAGULATION CONSULT NOTE - Initial Consult  Pharmacy Consult for warfarin Indication: CVA  Not on File  Patient Measurements: Height: 5\' 3"  (160 cm) Weight: 188 lb 11.2 oz (85.6 kg) IBW/kg (Calculated) : 56.9  Vital Signs: Temp: 99.4 F (37.4 C) (01/12 0608) Temp Source: Oral (01/12 0608) BP: 127/79 (01/12 9163) Pulse Rate: 93 (01/12 0608)  Labs: Recent Labs    01/17/17 0603 01/18/17 0641 01/19/17 0416  HGB 13.4 12.8* 12.7*  HCT 40.4 39.8* 37.6*  PLT 424 309 434  LABPROT 21.6* 19.6* 23.3*  INR 1.90 1.67 2.09  CREATININE 0.92 0.76 0.71    Estimated Creatinine Clearance: 85.5 mL/min (by C-G formula based on SCr of 0.71 mg/dL).   Medical History: Past Medical History:  Diagnosis Date  . Hypertension    Assessment: Pharmacy consulted to dose and monitor warfarin in this 67 year old male who was on warfarin prior to admission for a CVA.  INR = 1.9 is only slightly subtherapeutic on admission.  Home dose = warfarin 5 mg per tube daily  Of note, patient is receiving phenytoin but is a PTA medication.  Dosing history: Date INR Dose 1/9 1.9 None charted, last dose per med rec 1/8 1/10 1.9 7.5 mg  1/11 1.7       7.5 mg 1/12     2.1  Goal of Therapy:  INR 2-3 Monitor platelets by anticoagulation protocol: Yes   Plan:  INR at goal and increased significantly. Will order warfarin 5 mg tonight. Patient is on linezolid which may potentiate INR. Will recheck INR with AM labs tomorrow.  Napoleon Form, PharmD, BCPS Clinical Pharmacist 01/19/2017,11:59 AM

## 2017-01-20 LAB — PROTIME-INR
INR: 3.01
Prothrombin Time: 31 seconds — ABNORMAL HIGH (ref 11.4–15.2)

## 2017-01-20 MED ORDER — WARFARIN SODIUM 2.5 MG PO TABS
2.5000 mg | ORAL_TABLET | Freq: Once | ORAL | Status: AC
  Administered 2017-01-20: 17:00:00 2.5 mg via ORAL
  Filled 2017-01-20: qty 1

## 2017-01-20 NOTE — Plan of Care (Signed)
  Clinical Measurements: Signs and symptoms of infection will decrease 01/20/2017 1635 - Progressing by Oris Drone, RN  Pt remains on IV antibiotics Safety: Ability to remain free from injury will improve 01/20/2017 1635 - Progressing by Oris Drone, RN  Pt remains on [place] [facility]

## 2017-01-20 NOTE — Progress Notes (Signed)
[place] at [place] NAME: Anthony Jackson    MR#:  [id number]  DATE OF BIRTH:  [birth year]  SUBJECTIVE:  CHIEF COMPLAINT:  No chief complaint on file.  pt is arousable but lethargic  REVIEW OF SYSTEMS:  Review of Systems  Unable to perform ROS: Patient nonverbal   DRUG ALLERGIES:  Not on File VITALS:  Blood pressure 117/80, pulse 83, temperature 98.2 F (36.8 C), temperature source Oral, resp. rate 20, height 5\' 3"  (1.6 m), weight 85.6 kg (188 lb 11.2 oz), SpO2 96 %. PHYSICAL EXAMINATION:  Physical Exam  Constitutional: Anthony Jackson is well-developed, well-nourished, and in no distress.  HENT:  Head: Normocephalic and atraumatic.  Eyes: Conjunctivae and EOM are normal. Pupils are equal, round, and reactive to light.  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 Neck supple. No tracheal deviation present. No thyromegaly present.  Cardiovascular: Normal rate, regular rhythm and normal heart sounds.  Pulmonary/Chest: Effort normal and breath sounds normal. No respiratory distress. Anthony Jackson has no wheezes. Anthony Jackson exhibits no tenderness.  Abdominal: Soft. Bowel sounds are normal. Anthony Jackson exhibits no distension. There is no tenderness.  Musculoskeletal: Normal range of motion.  Neurological: No cranial nerve deficit.  Non-verbal and sleepy  Skin: Skin is warm and dry. No rash noted.  Psychiatric:  Non-verbal and sleepy   LABORATORY PANEL:  Male CBC Recent Labs  Lab 01/19/17 0416  WBC 5.5  HGB 12.7*  HCT 37.6*  PLT 434   ------------------------------------------------------------------------------------------------------------------ Chemistries  Recent Labs  Lab 01/16/17 0640  01/19/17 0416  NA 134*   < > 137  K 4.7   < > 3.8  CL 96*   < > 101  CO2 26   < > 27  GLUCOSE 118*   < > 115*  BUN 16   < > 16  CREATININE 0.78   < > 0.71  CALCIUM 9.9   < > 9.1  AST 54*  --   --   ALT 86*  --   --   ALKPHOS 247*  --   --   BILITOT  0.6  --   --    < > = values in this interval not displayed.   RADIOLOGY:  Dg Chest 2 View  Result Date: 01/19/2017 CLINICAL DATA:  Cough, hypertension EXAM: CHEST  2 VIEW COMPARISON:  January 16, 2017 FINDINGS: The heart size and mediastinal contours are stable. Both lungs are clear. 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 The visualized skeletal structures are unremarkable. IMPRESSION: No active cardiopulmonary disease. Electronically Signed   By: Abelardo Diesel M.D.   On: 01/19/2017 18:13   ASSESSMENT AND PLAN:  Kyler Germer  is a 67 y.o. male with a known history of brain tumor status post resection in October 2018 at [facility] had a stroke during resection,, essential hypertension is sent over from [place] to emergency department with a chief complaint of clogged G-tube patient was treated with antibiotics at [place] for possible UTI.  G-tube was replaced in the emergency department but patient was tachycardic and has low-grade fever and elevated lactic acid.  #Sepsis-likely due to urinary source- VRE  - present on admission -  Abx to Zyvox -  NEG  blood c/s - urine growing VRE  #Cough .  Chest x-ray  ruled out  pneumonia  * VRE UTI: urine c/s growing VRE - change Abx to Zyvox-po at  the time of discharge  #Mechanical obstruction of the  PEG tube PEG tube was replaced in the emergency department and functioning well now Resume feeds and monitor closely  #History of CVA during brain tumor resection Patient is on Coumadin. INR is 1.67-3.01 Coumadin management by pharmacy  #History of brain tumor status post resection in October 2018  at [place] follow-up with [facility] neurosurgery as recommended No interventions needed at this time Will consider repeat CT of the  head if no clinical improvement  #History of seizures Continue Keppra and Dilantin     Likely D/[facility] back to [facility] once stable and Insurance Auth back   All the records are reviewed and case  discussed with Care Management/Social Worker. Management plans discussed with the patient, family (wife at bedside) and they are in agreement.  CODE STATUS: Full Code  TOTAL TIME TAKING CARE OF THIS PATIENT: 35 minutes.   More than 50% of the time was spent in counseling/coordination of care: YES  POSSIBLE D/C IN 1-2 DAYS, DEPENDING ON CLINICAL CONDITION.   Nicholes Mango M.D on 01/20/2017 at 1:53 PM  Between 7am to 6pm - Pager - [phone]   After 6pm go to [email or web address] - Proofreader  [facility] [place] [facility]  Office  [phone]  CC: Primary care physician; System, Provider Not In  Note: This dictation was prepared with Dragon dictation along with smaller phrase technology. Any transcriptional errors that result from this process are unintentional.

## 2017-01-20 NOTE — Progress Notes (Signed)
ANTICOAGULATION CONSULT NOTE - Initial Consult  Pharmacy Consult for warfarin Indication: CVA  Not on File  Patient Measurements: Height: 5\' 3"  (160 cm) Weight: 188 lb 11.2 oz (85.6 kg) IBW/kg (Calculated) : 56.9  Vital Signs: Temp: 98.2 F (36.8 C) (01/13 0443) Temp Source: Oral (01/13 0443) BP: 117/80 (01/13 0443) Pulse Rate: 83 (01/13 0443)  Labs: Recent Labs    01/18/17 0641 01/19/17 0416 01/20/17 0504  HGB 12.8* 12.7*  --   HCT 39.8* 37.6*  --   PLT 309 434  --   LABPROT 19.6* 23.3* 31.0*  INR 1.67 2.09 3.01  CREATININE 0.76 0.71  --     Estimated Creatinine Clearance: 85.5 mL/min (by C-G formula based on SCr of 0.71 mg/dL).   Medical History: Past Medical History:  Diagnosis Date  . Hypertension    Assessment: Pharmacy consulted to dose and monitor warfarin in this 67 year old male who was on warfarin prior to admission for a CVA.  INR = 1.9 is only slightly subtherapeutic on admission.  Home dose = warfarin 5 mg per tube daily  Of note, patient is receiving phenytoin but is a PTA medication.  Dosing history: Date INR Dose 1/9 1.9 None charted, last dose per med rec 1/8 1/10 1.9 7.5 mg  1/11 1.7       7.5 mg 1/12     2.1       5 mg 1/13     3.01  Goal of Therapy:  INR 2-3 Monitor platelets by anticoagulation protocol: Yes   Plan:  INR continues to increase significantly. Will order warfarin 2.5 mg tonight. Patient is on linezolid which may potentiate INR. Will recheck INR with AM labs tomorrow.  Napoleon Form, PharmD, BCPS Clinical Pharmacist 01/20/2017,10:17 AM

## 2017-01-21 LAB — CULTURE, BLOOD (ROUTINE X 2)
CULTURE: NO GROWTH
Culture: NO GROWTH
SPECIAL REQUESTS: ADEQUATE
Special Requests: ADEQUATE

## 2017-01-21 LAB — PROTIME-INR
INR: 3.61
Prothrombin Time: 35.7 seconds — ABNORMAL HIGH (ref 11.4–15.2)

## 2017-01-21 MED ORDER — LINEZOLID 600 MG PO TABS
600.0000 mg | ORAL_TABLET | Freq: Two times a day (BID) | ORAL | Status: DC
  Administered 2017-01-21 – 2017-01-22 (×2): 600 mg via ORAL
  Filled 2017-01-21 (×3): qty 1

## 2017-01-21 NOTE — Clinical Social Work Note (Signed)
[facility] called CSW and stated that they needed a new PT evaluation. PT will be assessing patient this afternoon. Shela Leff MSW,LCSW [phone]

## 2017-01-21 NOTE — Clinical Social Work Note (Signed)
PT note now in and CSW has faxed information to [facility]. Will await their disposition. Shela Leff MSW,LcSW [phone]

## 2017-01-21 NOTE — Progress Notes (Addendum)
[facility] [place] NAME: Anthony Jackson    MR#:  [id number]  DATE OF BIRTH:  1950-04-11  SUBJECTIVE:  CHIEF COMPLAINT:  No chief complaint on file.  pt is arousable but lethargic  REVIEW OF SYSTEMS:  Review of Systems  Unable to perform ROS: Patient nonverbal   DRUG ALLERGIES:  Not on File VITALS:  Blood pressure 123/89, pulse 98, temperature 98.5 F (36.9 C), temperature source Oral, resp. rate 16, height 5\' 3"  (1.6 m), weight 85.6 kg (188 lb 11.2 oz), SpO2 99 %. PHYSICAL EXAMINATION:  Physical Exam  Constitutional: He is well-developed, well-nourished, and in no distress.  HENT:  Head: Normocephalic and atraumatic.  Eyes: Conjunctivae and EOM are normal. Pupils are equal, round, and reactive to light.  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 Neck supple. No tracheal deviation present. No thyromegaly present.  Cardiovascular: Normal rate, regular rhythm and normal heart sounds.  Pulmonary/Chest: Effort normal and breath sounds normal. No respiratory distress. He has no wheezes. He exhibits no tenderness.  Abdominal: Soft. Bowel sounds are normal. He exhibits no distension. There is no tenderness.  Musculoskeletal: Normal range of motion.  Neurological: No cranial nerve deficit.  Non-verbal and sleepy  Skin: Skin is warm and dry. No rash noted.  Psychiatric:  Non-verbal and sleepy   LABORATORY PANEL:  Male CBC Recent Labs  Lab 01/19/17 0416  WBC 5.5  HGB 12.7*  HCT 37.6*  PLT 434   ------------------------------------------------------------------------------------------------------------------ Chemistries  Recent Labs  Lab 01/16/17 0640  01/19/17 0416  NA 134*   < > 137  K 4.7   < > 3.8  CL 96*   < > 101  CO2 26   < > 27  GLUCOSE 118*   < > 115*  BUN 16   < > 16  CREATININE 0.78   < > 0.71  CALCIUM 9.9   < > 9.1  AST 54*  --   --   ALT 86*  --   --   ALKPHOS 247*  --   --   BILITOT  0.6  --   --    < > = values in this interval not displayed.   RADIOLOGY:  No results found. ASSESSMENT AND PLAN:  Anthony Jackson  is a 67 y.o. 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  G-tube was replaced in the emergency department but patient was tachycardic and has low-grade fever and elevated lactic acid.  #Sepsis-likely due to urinary source- VRE  - present on admission -  Abx to Zyvox -  NEG  blood c/s - urine growing VRE  #Cough .  Chest x-ray  ruled out  pneumonia  * VRE UTI: urine c/s growing VRE - change Abx to Zyvox-po , continue for a total of 14 days stop date will be January 31, 2017  #Mechanical obstruction of the PEG tube PEG tube was replaced in the emergency department and functioning well now Resume feeds and monitor closely  #History of CVA during brain tumor resection Patient is on Coumadin. INR is 1.67-3.01--3.6 -Coumadin coagulopathy secondary to drug interaction with the Zyvox.  Hold Coumadin today.  Watch for symptoms and signs of bleeding  or bruising Coumadin management by pharmacy  #History of brain tumor status post resection in October 2018  at [place] follow-up with [facility] neurosurgery as recommended No interventions needed at this time Will consider repeat CT of the  head if no clinical improvement  #History of seizures Continue Keppra and Dilantin     Likely D/[facility] once  [employer] back, Education officer, museum is following up with [employer]   All the records are reviewed and case discussed with Care Automotive engineer. Management plans discussed with the patient's daughter Minerva [id number],QAS is in  agreement.  CODE STATUS: Full  Code  TOTAL TIME TAKING CARE OF THIS PATIENT: 30 minutes.   More than 50% of the time was spent in counseling/coordination of care: YES  POSSIBLE D/C IN 1-2 DAYS, DEPENDING ON CLINICAL CONDITION.   Nicholes Mango M.D on 01/21/2017 at 3:10 PM  Between 7am to 6pm - Pager - [phone]   After 6pm go to [email or web address] - Proofreader  [facility]  Office  [phone]  CC: Primary care physician; System, Provider Not In  Note: This dictation was prepared with Dragon dictation along with smaller phrase technology. Any transcriptional errors that result from this process are unintentional.

## 2017-01-21 NOTE — Progress Notes (Signed)
Physical Therapy Treatment Patient Details Name: Anthony Jackson MRN: [id number] DOB: 1950-04-15 Today's Date: 01/21/2017    History of Present Illness  Pt. is a 67 y.o. 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 Pt. had a stroke during the resection.  Pt has had multiple admissions to [facility] since then, and has been at [facility] prior to this admission.      PT Comments    Pt agreeable to PT; denies pain. Pt requires Max A for bed mobility and sitting balance. Demonstrating inconsistent ability to self assist for bed mobility and seated posture; does not demonstrate self response to R lean, requiring cues. Inconsistent despite cues as well. Fatigues quickly. Dependent for repositioning upward in bed. Continue to recommend discharge to skilled nursing facility for continued rehab efforts for endurance, strength and seated balance.    Follow Up Recommendations  SNF     Equipment Recommendations       Recommendations for Other Services       Precautions / Restrictions Precautions Precautions: Fall Restrictions Weight Bearing Restrictions: No    Mobility  Bed Mobility Overal bed mobility: Needs Assistance Bed Mobility: Supine to Sit;Sit to Supine     Supine to sit: Max assist Sit to supine: Max assist   General bed mobility comments: uses LUE to attempt assist with supine to sit, but inconsistently often letting go of rail. Total assist to reposition upward in bed.    Transfers                 General transfer comment: Not attempted due to poor sitting balance  Ambulation/Gait                 Stairs            Wheelchair Mobility    Modified Rankin (Stroke Patients Only)       Balance Overall balance assessment: Needs assistance Sitting-balance support: Feet supported;Single extremity supported(inconsistent upper extremity support) Sitting balance-Leahy Scale: Zero Sitting balance - Comments:  Heavy lean to the right; unable to maintain upright without Max A. Inconsistent use of LUE with cues for self assisted balance. Occasional attempt with RUE, but gives little support.  Postural control: Right lateral lean                                  Cognition Arousal/Alertness: Awake/alert Behavior During Therapy: Flat affect;WFL for tasks assessed/performed Overall Cognitive Status: No family/caregiver present to determine baseline cognitive functioning                                        Exercises Other Exercises Other Exercises: Seated edge of bed balance work encouraging righting to the left and self assist.     General Comments        Pertinent Vitals/Pain Pain Assessment: No/denies pain    Home Living                      Prior Function            PT Goals (current goals can now be found in the care plan section) Progress towards PT goals: Not progressing toward goals - comment    Frequency    Min 2X/week      PT Plan Current  plan remains appropriate    Co-evaluation              AM-PAC PT "6 Clicks" Daily Activity  Outcome Measure  Difficulty turning over in bed (including adjusting bedclothes, sheets and blankets)?: Unable Difficulty moving from lying on back to sitting on the side of the bed? : Unable Difficulty sitting down on and standing up from a chair with arms (e.g., wheelchair, bedside commode, etc,.)?: Unable Help needed moving to and from a bed to chair (including a wheelchair)?: Total Help needed walking in hospital room?: Total Help needed climbing 3-5 steps with a railing? : Total 6 Click Score: 6    End of Session   Activity Tolerance: Patient limited by fatigue Patient left: in bed;with call bell/phone within reach;with bed alarm set   PT Visit Diagnosis: Muscle weakness (generalized) (M62.81);Difficulty in walking, not elsewhere classified (R26.2)     Time: 3709-6438 PT Time  Calculation (min) (ACUTE ONLY): 18 min  Charges:  $Therapeutic Activity: 8-22 mins                    G CodesLarae Grooms, PTA 01/21/2017, 4:04 PM

## 2017-01-21 NOTE — Clinical Social Work Note (Signed)
CSW has begun the prior auth again for patient to go to [facility] when medically cleared. Insurance would not give prior auth to previous CSW Friday due to stating that they felt patient was not medically ready for discharge. Shela Leff MSW,LCSW [phone]

## 2017-01-21 NOTE — Progress Notes (Signed)
ANTICOAGULATION CONSULT NOTE - Initial Consult  Pharmacy Consult for warfarin Indication: CVA  Not on File  Patient Measurements: Height: 5\' 3"  (160 cm) Weight: 188 lb 11.2 oz (85.6 kg) IBW/kg (Calculated) : 56.9  Vital Signs: Temp: 98.6 F (37 C) (01/14 0507) Temp Source: Axillary (01/14 0507) BP: 135/95 (01/14 0507) Pulse Rate: 84 (01/14 0507)  Labs: Recent Labs    01/19/17 0416 01/20/17 0504 01/21/17 0323  HGB 12.7*  --   --   HCT 37.6*  --   --   PLT 434  --   --   LABPROT 23.3* 31.0* 35.7*  INR 2.09 3.01 3.61  CREATININE 0.71  --   --     Estimated Creatinine Clearance: 85.5 mL/min (by C-G formula based on SCr of 0.71 mg/dL).   Medical History: Past Medical History:  Diagnosis Date  . Hypertension    Assessment: Pharmacy consulted to dose and monitor warfarin in this 67 year old male who was on warfarin prior to admission for a CVA.  INR = 1.9 is only slightly subtherapeutic on admission.  Home dose = warfarin 5 mg per tube daily  Of note, patient is receiving phenytoin but is a PTA medication.  Dosing history: Date INR Dose 1/9 1.9 None charted, last dose per med rec 1/8 1/10 1.9 7.5 mg  1/11 1.7       7.5 mg 1/12     2.1       5 mg 1/13     3.01 2.5 mg 1/14 3.61  Goal of Therapy:  INR 2-3 Monitor platelets by anticoagulation protocol: Yes   Plan:  Hold VKA today due to quickly increasing, supratherapeutic INR. Will recheck INR tomorrow with AM labs.   Laural Benes, PharmD, BCPS Clinical Pharmacist 01/21/2017,7:37 AM

## 2017-01-21 NOTE — Plan of Care (Signed)
k

## 2017-01-22 LAB — PROTIME-INR
INR: 3.35
Prothrombin Time: 33.7 seconds — ABNORMAL HIGH (ref 11.4–15.2)

## 2017-01-22 MED ORDER — LINEZOLID 600 MG PO TABS: 600.0000 mg | ORAL_TABLET | Freq: Two times a day (BID) | ORAL | 0 refills | Status: AC

## 2017-01-22 MED ORDER — DILTIAZEM HCL 60 MG PO TABS: 60.0000 mg | ORAL_TABLET | Freq: Three times a day (TID) | ORAL | Status: AC

## 2017-01-22 MED ORDER — CITALOPRAM HYDROBROMIDE 20 MG PO TABS: 10.0000 mg | ORAL_TABLET | Freq: Every day | ORAL | Status: DC

## 2017-01-22 MED ORDER — OSMOLITE 1.5 CAL PO LIQD: 237.0000 mL | Freq: Every day | ORAL | 0 refills | Status: AC

## 2017-01-22 MED ORDER — PRO-STAT SUGAR FREE PO LIQD: 30.0000 mL | Freq: Two times a day (BID) | ORAL | 0 refills | Status: AC

## 2017-01-22 MED ORDER — SODIUM CHLORIDE 0.9 % IV SOLN
INTRAVENOUS | Status: DC
  Administered 2017-01-22: 11:00:00 via INTRAVENOUS

## 2017-01-22 MED ORDER — WARFARIN SODIUM 2 MG PO TABS
2.0000 mg | ORAL_TABLET | Freq: Once | ORAL | Status: DC
  Filled 2017-01-22: qty 1

## 2017-01-22 MED ORDER — CITALOPRAM HYDROBROMIDE 10 MG PO TABS: 10.0000 mg | ORAL_TABLET | Freq: Every day | ORAL | Status: AC

## 2017-01-22 MED ORDER — FREE WATER: 180.0000 mL | Freq: Every day | Status: AC

## 2017-01-22 NOTE — NC FL2 (Signed)
[place] LEVEL OF CARE SCREENING TOOL     IDENTIFICATION  Patient Name: Anthony Jackson Birthdate: 06-13-1950 Sex: male Admission Date (Current Location): 01/16/2017  [facility] and [place] Number:  Selena Lesser ([id number]) Facility and Address:  [facility], [address], [place], [place] [address]      Provider Number: [id number]  Attending Physician Name and Address:  Nicholes Mango, MD  Relative Name and Phone Number:       Current Level of Care: Hospital Recommended Level of Care: [place] Prior Approval Number:    Date Approved/Denied:   PASRR Number: [id number])  Discharge Plan: SNF    Current Diagnoses: Patient Active Problem List   Diagnosis Date Noted  . Sepsis ([place]) 01/16/2017    Orientation RESPIRATION BLADDER Height & Weight     Self  Normal Continent Weight: 188 lb 11.2 oz (85.6 kg) Height:  5\' 3"  (160 cm)  BEHAVIORAL SYMPTOMS/MOOD NEUROLOGICAL BOWEL NUTRITION STATUS      Continent Feeding tube(PEG)  AMBULATORY STATUS COMMUNICATION OF NEEDS Skin   Extensive Assist Verbally Normal                       Personal Care Assistance Level of Assistance  Bathing, Feeding, Dressing Bathing Assistance: Limited assistance Feeding assistance: Limited assistance Dressing Assistance: Limited assistance     Functional Limitations Info  Sight, Hearing, Speech Sight Info: Impaired Hearing Info: Impaired Speech Info: Impaired    SPECIAL CARE FACTORS FREQUENCY  PT (By licensed PT), OT (By licensed OT)     PT Frequency: (5) OT Frequency: (5)            Contractures      Additional Factors Info  Code Status, Allergies Code Status Info: (not on file. ) Allergies Info: (not on file. )           Current Medications (01/22/2017):  This is the current hospital active medication list Current Facility-Administered Medications  Medication Dose Route Frequency Provider Last Rate Last Dose  . 0.9  %  sodium chloride infusion   Intravenous Continuous Sumit Branham, MD 75 mL/hr at 01/22/17 1107    . acetaminophen (TYLENOL) tablet 650 mg  650 mg Per Tube Q6H PRN Nicholes Mango, MD   650 mg at 01/17/17 2034  . amantadine (SYMMETREL) 50 MG/5ML solution 100 mg  100 mg Per Tube BID Nicholes Mango, MD   100 mg at 01/22/17 0625  . atorvastatin (LIPITOR) tablet 10 mg  10 mg Per Tube q1800 Yannely Kintzel, MD   10 mg at 01/21/17 1655  . diltiazem (CARDIZEM) tablet 60 mg  60 mg Oral Q8H Max Sane, MD   60 mg at 01/22/17 1304  . feeding supplement (OSMOLITE 1.5 CAL) liquid 237 mL  237 mL Per Tube 5 X Daily Shaylan Tutton, MD 0 mL/hr at 01/20/17 2045 237 mL at 01/22/17 1250  . feeding supplement (PRO-STAT SUGAR FREE 64) liquid 30 mL  30 mL Per Tube BID Kearra Calkin, MD   30 mL at 01/22/17 1002  . free water 180 mL  180 mL Per Tube 5 X Daily Roshan Roback, MD   180 mL at 01/22/17 1107  . levETIRAcetam (KEPPRA) 100 MG/ML solution 1,000 mg  1,000 mg Per Tube BID Kayode Petion, MD   1,000 mg at 01/22/17 1001  . linezolid (ZYVOX) tablet 600 mg  600 mg Oral Q12H Angelica Frandsen, MD   600 mg at 01/22/17 1001  . magic mouthwash  10 mL Oral TID Vaughan Basta, MD   10 mL at 01/22/17 1001  . Melatonin TABS 5 mg  5 mg Per Tube QHS Nicholes Mango, MD   5 mg at 01/21/17 2115  . metoprolol tartrate (LOPRESSOR) tablet 100 mg  100 mg Per Tube BID Airyonna Franklyn, MD   100 mg at 01/22/17 1000  . phenytoin (DILANTIN) 125 MG/5ML suspension 150 mg  150 mg Per Tube Q8H Yukio Bisping, MD   150 mg at 01/22/17 1304  . polyethylene glycol (MIRALAX / GLYCOLAX) packet 17 g  17 g Per Tube Daily Chord Takahashi, MD   17 g at 01/20/17 1111  . sennosides (SENOKOT) 8.8 MG/5ML syrup 8.8 mg  8.8 mg Per Tube Daily Rivers Gassmann, MD   8.8 mg at 01/21/17 1109  . warfarin (COUMADIN) tablet 2 mg  2 mg Oral ONCE-1800 Hallaji, Sheema M, RPH      . Warfarin - Pharmacist Dosing Inpatient   Does not apply q1800 Lenis Noon, [facility]         Discharge  Medications: Please see discharge summary for a list of discharge medications.  Relevant Imaging Results:  Relevant Lab Results:   Additional Information (SSN: [id number])  Sample, Veronia Beets, LCSW

## 2017-01-22 NOTE — Progress Notes (Signed)
Called report to [place] at [employer]  Patient is going to room 605

## 2017-01-22 NOTE — Progress Notes (Signed)
Patient is medically stable for D/C back to [facility] today. [employer] SNF authorization via [employer] is still pending. Per Alger Simons liaison he spoke with IT trainer and they will accept patient today under medicaid if Mcarthur Rossetti does not approve SNF. Per [facility] liaison patient can come today to room 605. RN will call report and arrange EMS for transport. Clinical Education officer, museum (CSW) sent D/C orders to [facility] via [facility]. CSW contacted patient's daughter Sharlett Iles and made her aware of above. Please reconsult if future social work needs arise. CSW signing off.   [employer], LCSW [phone]

## 2017-01-22 NOTE — Discharge Instructions (Signed)
Follow-up with primary care physician in 3-5 days Follow-up with psychiatry in 1 week Repeat PT/INR on 01/23/2017 and PCP at the facility to monitor INR levels

## 2017-01-22 NOTE — Progress Notes (Signed)
Clinical Education officer, museum (CSW) contacted [employer] to inquire about insurance authorization. Per [employer] this case is being reviewed by the medical director. CSW inquired about a peer to peer. Per [employer] they will send the case manager an email asking her to call CSW. [facility] liaison is aware of above.   [employer], LCSW [phone]

## 2017-01-22 NOTE — Discharge Summary (Signed)
[facility] [place] NAME: Anthony Jackson    MR#:  [id number]  DATE OF BIRTH:  1950-08-12  DATE OF ADMISSION:  01/16/2017 ADMITTING PHYSICIAN: Nicholes Mango, MD  DATE OF DISCHARGE:  01/22/17  PRIMARY CARE PHYSICIAN: System, Provider Not In    ADMISSION DIAGNOSIS:  Malfunction of gastrostomy tube ([place]) [K94.23] Sepsis, due to unspecified organism ([place]) [A41.9]  DISCHARGE DIAGNOSIS:  Active Problems:   Sepsis ([place])   SECONDARY DIAGNOSIS:   Past Medical History:  Diagnosis Date  . Hypertension     HOSPITAL COURSE:  HPI Anthony Jackson  is a 67 y.o. male with a known history of brain tumor status post resection in October 2018 at [facility] had a stroke during resection,, essential hypertension is sent over from [place] to emergency department with a chief complaint of clogged G-tube patient was treated with antibiotics at [place] for possible UTI.  G-tube was replaced in the emergency department but patient was tachycardic and has low-grade fever and elevated lactic acid.  Patient is started on broad-spectrum IV antibiotics for code sepsis and hospitalist team was called to admit the patient.  Patient is lethargic during my examination.  Daughter is at bedside  HenryMitchellis a66 y.o.malewith a known history of brain tumor status post resection in October 2018 at [facility] had a stroke during resection,, essential hypertension is sent over from [place] to emergency department with a chief complaint of clogged G-tube patient was treated with antibiotics at [place] for possible UTI.G-tube was replaced in the emergency department but patient was tachycardic and has low-grade fever and elevated lactic acid.  #Sepsis-likely due to urinary source- VRE  - present on admission -  Abx to Zyvox -  NEG  blood c/s - urine growing VRE  #Cough .  Chest x-ray  ruled out  pneumonia  * VRE UTI: urine c/s growing VRE -  change Abx to Zyvox-po , continue for a total of 14 days stop date will be January 31, 2017  #Mechanical obstruction of the PEG tube PEG tube was replaced in the emergency department and functioning well now Resume feeds and monitor closely  #History of CVA during brain tumor resection Patient's Coumadin on hold tonight  INR is 1.67-3.01--3.6 -3.35  Coumadin coagulopathy secondary to drug interaction with the Zyvox.  Hold Coumadin today.  Watch for symptoms and signs of bleeding or bruising Coumadin management by PCP at the facility   #History of brain tumor status post resection in October 2018 at [place] follow-up with [facility] neurosurgery as recommended No interventions needed at this time Will consider repeat CT of the  head if no clinical improvement   #History of seizures Continue Keppra and Dilantin   # Depression Start celexa, f/u with psych     DISCHARGE CONDITIONS:   Stable   CONSULTS OBTAINED:  Treatment Team:  Anthony Lex, MD   PROCEDURES  None   DRUG ALLERGIES:  Not on File  DISCHARGE MEDICATIONS:   Allergies as of 01/22/2017   Not on File     Medication List    STOP taking these medications   sulfamethoxazole-trimethoprim 800-160 MG tablet Commonly known as:  BACTRIM DS,SEPTRA DS   warfarin 5 MG tablet Commonly known as:  COUMADIN     TAKE these medications   acetaminophen 325 MG tablet Commonly known as:  TYLENOL Place 650 mg into feeding tube every 6 (six) hours as needed.   amantadine 50 MG/5ML  solution Commonly known as:  SYMMETREL Place 100 mg into feeding tube 2 (two) times daily.   atorvastatin 10 MG tablet Commonly known as:  LIPITOR Place 10 mg into feeding tube daily.   bacitracin ointment Apply 1 application topically 2 (two) times daily.   DILANTIN 125 MG/5ML suspension Generic drug:  phenytoin Place 150 mg into feeding tube every 8 (eight) hours.   diltiazem 60 MG tablet Commonly known as:  CARDIZEM Take  1 tablet (60 mg total) by mouth every 8 (eight) hours.   feeding supplement (OSMOLITE 1.5 CAL) Liqd Place 237 mLs into feeding tube 5 (five) times daily.   feeding supplement (PRO-STAT SUGAR FREE 64) Liqd Place 30 mLs into feeding tube 2 (two) times daily.   free water Soln Place 180 mLs into feeding tube 5 (five) times daily.   levETIRAcetam 1000 MG tablet Commonly known as:  KEPPRA Place 1,000 mg into feeding tube 2 (two) times daily.   linezolid 600 MG tablet Commonly known as:  ZYVOX Take 1 tablet (600 mg total) by mouth every 12 (twelve) hours for 9 days.   Melatonin 3 MG Tabs Place 6 mg into feeding tube at bedtime.   metoprolol tartrate 100 MG tablet Commonly known as:  LOPRESSOR Place 100 mg into feeding tube 2 (two) times daily.   polyethylene glycol packet Commonly known as:  MIRALAX / GLYCOLAX Place 17 g into feeding tube daily.   senna 8.6 MG tablet Commonly known as:  SENOKOT Place 1 tablet into feeding tube daily.   tamsulosin 0.4 MG Caps capsule Commonly known as:  FLOMAX 0.4 mg daily after breakfast.        DISCHARGE INSTRUCTIONS:   Follow-up with primary care physician in 3-5 days Follow-up with psychiatry in 1 week Repeat PT/INR on 01/23/2017 and PCP at the facility to monitor INR levels  DIET:  PEG feeds with water flushes  DISCHARGE CONDITION:  Fair  ACTIVITY:  Activity as tolerated  OXYGEN:  Home Oxygen: No.   Oxygen Delivery: room air  DISCHARGE LOCATION:  nursing home   If you experience worsening of your admission symptoms, develop shortness of breath, life threatening emergency, suicidal or homicidal thoughts you must seek medical attention immediately by calling 911 or calling your MD immediately  if symptoms less severe.  You Must read complete instructions/literature along with all the possible adverse reactions/side effects for all the Medicines you take and that have been prescribed to you. Take any new Medicines after  you have completely understood and accpet all the possible adverse reactions/side effects.   Please note  You were cared for by a hospitalist during your hospital stay. If you have any questions about your discharge medications or the care you received while you were in the hospital after you are discharged, you can call the unit and asked to speak with the hospitalist on call if the hospitalist that took care of you is not available. Once you are discharged, your primary care physician will handle any further medical issues. Please note that NO REFILLS for any discharge medications will be authorized once you are discharged, as it is imperative that you return to your primary care physician (or establish a relationship with a primary care physician if you do not have one) for your aftercare needs so that they can reassess your need for medications and monitor your lab values.     Today  No chief complaint on file.  Patient with limited speech following the brain tumor resection  according to the daughter  ROS: Limited General no acute distress reports weakness, Respiratory denies any shortness of breath Gastroenterology denies any nausea vomiting or abdominal pain   VITAL SIGNS:  Blood pressure 132/87, pulse 88, temperature 98.1 F (36.7 C), temperature source Oral, resp. rate 18, height 5\' 3"  (1.6 m), weight 85.6 kg (188 lb 11.2 oz), SpO2 98 %.  I/O:    Intake/Output Summary (Last 24 hours) at 01/22/2017 1515 Last data filed at 01/21/2017 2100 Gross per 24 hour  Intake 337 ml  Output -  Net 337 ml    PHYSICAL EXAMINATION:  GENERAL:  67 y.o.-year-old patient lying in the bed with no acute distress.  EYES: Pupils equal, round, reactive to light and accommodation. No scleral icterus.  HEENT: Head atraumatic, normocephalic.  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 No thyroid enlargement, no tenderness.  LUNGS: Normal breath  sounds bilaterally, no wheezing, rales,rhonchi or crepitation. No use of accessory muscles of respiration.  CARDIOVASCULAR: S1, S2 normal. No murmurs, rubs, or gallops.  ABDOMEN: Soft, nontender, nondistended. Bowel sounds present. No organomegaly or mass.  EXTREMITIES: No pedal edema, cyanosis, or clubbing.  NEUROLOGIC: Patient is arousable , answers some, according to the daughter patient is at his baseline, scar on the left side of the brain is healing well PSYCHIATRIC: The patient is disoriented SKIN: No obvious rash, lesion, or ulcer    DATA REVIEW:   CBC Recent Labs  Lab 01/19/17 0416  WBC 5.5  HGB 12.7*  HCT 37.6*  PLT 434    Chemistries  Recent Labs  Lab 01/16/17 0640  01/19/17 0416  NA 134*   < > 137  K 4.7   < > 3.8  CL 96*   < > 101  CO2 26   < > 27  GLUCOSE 118*   < > 115*  BUN 16   < > 16  CREATININE 0.78   < > 0.71  CALCIUM 9.9   < > 9.1  AST 54*  --   --   ALT 86*  --   --   ALKPHOS 247*  --   --   BILITOT 0.6  --   --    < > = values in this interval not displayed.    Cardiac Enzymes No results for input(s): TROPONINI in the last 168 hours.  Microbiology Results  Results for orders placed or performed during the hospital encounter of 01/16/17  Blood Culture (routine x 2)     Status: None   Collection Time: 01/16/17  6:40 AM  Result Value Ref Range Status   Specimen Description BLOOD RIGHT [facility]  Final   Special Requests   Final    BOTTLES DRAWN AEROBIC AND ANAEROBIC Blood Culture adequate volume   Culture   Final    NO GROWTH 5 DAYS Performed at [facility], [address], [place], [place] [address]    Report Status 01/21/2017 FINAL  Final  Blood Culture (routine x 2)     Status: None   Collection Time: 01/16/17  6:40 AM  Result Value Ref Range Status   Specimen Description BLOOD RIGHT HAND  Final   Special Requests   Final    BOTTLES DRAWN AEROBIC AND ANAEROBIC Blood Culture adequate volume   Culture   Final    NO GROWTH 5  DAYS Performed at [facility], [address], [place], [place] [address]    Report Status 01/21/2017 FINAL  Final  Urine culture     Status: Abnormal   Collection Time: 01/16/17  6:44 AM  Result Value Ref Range Status   Specimen Description   Final    URINE, CLEAN CATCH Performed at [facility], [address] [place], [place] [address]    Special Requests   Final    Normal Performed at [facility], [address] [place], [place] [address]    Culture (A)  Final    >=100,000 COLONIES/mL VANCOMYCIN RESISTANT ENTEROCOCCUS   Report Status 01/18/2017 FINAL  Final   Organism ID, Bacteria VANCOMYCIN RESISTANT ENTEROCOCCUS (A)  Final      Susceptibility   Vancomycin resistant enterococcus - MIC*    AMPICILLIN >=32 RESISTANT Resistant     LEVOFLOXACIN >=8 RESISTANT Resistant     NITROFURANTOIN 128 RESISTANT Resistant     VANCOMYCIN >=32 RESISTANT Resistant     LINEZOLID 2 SENSITIVE Sensitive     * >=100,000 COLONIES/mL VANCOMYCIN RESISTANT ENTEROCOCCUS  MRSA PCR Screening     Status: None   Collection Time: 01/16/17  2:15 PM  Result Value Ref Range Status   MRSA by PCR NEGATIVE NEGATIVE Final    Comment:        The GeneXpert MRSA Assay (FDA approved for NASAL specimens only), is one component of a comprehensive MRSA colonization surveillance program. It is not intended to diagnose MRSA infection nor to guide or monitor treatment for MRSA infections. Performed at [facility], [address] [place], [place] [address]     RADIOLOGY:  Dg Chest 2 View  Result Date: 01/19/2017 CLINICAL DATA:  Cough, hypertension EXAM: CHEST  2 VIEW COMPARISON:  January 16, 2017 FINDINGS: The heart size and mediastinal contours are stable. Both lungs are clear. 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 The visualized skeletal structures are unremarkable. IMPRESSION: No active cardiopulmonary disease. Electronically Signed   By: Abelardo Diesel M.D.   On:  01/19/2017 18:13    EKG:   Orders placed or performed during the hospital encounter of 01/16/17  . ED EKG 12-Lead  . ED EKG 12-Lead  . EKG 12-Lead  . EKG 12-Lead      Management plans discussed with the patient, family and they are in agreement.  CODE STATUS:     Code Status Orders  (From admission, onward)        Start     Ordered   01/16/17 1240  Full code  Continuous     01/16/17 1239    Code Status History    Date Active Date Inactive Code Status Order ID Comments User Context   This patient has a current code status but no historical code status.      TOTAL TIME TAKING CARE OF THIS PATIENT: 45 minutes.   Note: This dictation was prepared with Dragon dictation along with smaller phrase technology. Any transcriptional errors that result from this process are unintentional.   @[facility] @  on 01/22/2017 at 3:15 PM  Between 7am to 6pm - Pager - [phone]  After 6pm go to [email or web address] - password [email or web address]  [facility]  Office  [phone]  CC: Primary care physician; System, Provider Not In

## 2017-01-22 NOTE — Progress Notes (Signed)
ANTICOAGULATION CONSULT NOTE - Initial Consult  Pharmacy Consult for warfarin Indication: CVA  Not on File  Patient Measurements: Height: 5\' 3"  (160 cm) Weight: 188 lb 11.2 oz (85.6 kg) IBW/kg (Calculated) : 56.9  Vital Signs: Temp: 98.3 F (36.8 C) (01/15 0610) Temp Source: Oral (01/15 0610) BP: 130/81 (01/15 0610) Pulse Rate: 85 (01/15 0610)  Labs: Recent Labs    01/20/17 0504 01/21/17 0323 01/22/17 0624  LABPROT 31.0* 35.7* 33.7*  INR 3.01 3.61 3.35    Estimated Creatinine Clearance: 85.5 mL/min (by C-G formula based on SCr of 0.71 mg/dL).   Medical History: Past Medical History:  Diagnosis Date  . Hypertension    Assessment: Pharmacy consulted to dose and monitor warfarin in this 67 year old male who was on warfarin prior to admission for a CVA.  INR = 1.9 is only slightly subtherapeutic on admission.  Home dose = warfarin 5 mg per tube daily  Of note, patient is receiving phenytoin but is a PTA medication.  Dosing history: Date INR Dose 1/9 1.9 None charted, last dose per med rec 1/8 1/10 1.9 7.5 mg  1/11 1.7       7.5 mg 1/12     2.1       5 mg 1/13     3.01 2.5 mg 1/14 3.61 HOLD 1/15 3.35  Goal of Therapy:  INR 2-3 Monitor platelets by anticoagulation protocol: Yes   Plan:  INR still supratherapeutic, but trending down. Patient is on linezolid which can increase INR.  Will give warfarin 2mg  x 1 tonight since INR trending down.  Will recheck INR tomorrow with AM labs.   Pernell Dupre, PharmD, BCPS Clinical Pharmacist 01/22/2017,8:41 AM

## 2017-01-22 NOTE — Progress Notes (Signed)
[employer] authorization has been received, auth # [id number]. [facility] liaison is aware of above. Patient's daughter Sharlett Iles is aware of above.   [employer], LCSW [phone]

## 2017-01-22 NOTE — Progress Notes (Signed)
Nutrition Follow-up  DOCUMENTATION CODES:   Obesity unspecified  INTERVENTION:  Continue Osmolite 1.5 Cal 1 can 5 times daily + Pro-Stat 30 ml BID via G-tube. Provides 1975 kcal, 105 grams protein, 905 mL H2O daily.  Continue free water flush of 90 mL before and after each bolus tube feeding. Provides 1805 mL H2O daily including water in tube feeding.  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  Ongoing.  GOAL:   Patient will meet greater than or equal to 90% of their needs  Met with tube feed regimen.  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  -Found to have VRE UTI. -Plan is for patient to discharge back to [facility] once insurance authorization is back.  No family members at bedside at time of RD assessment. Discussed with RN patient tolerating tube feed regimen well. Also discussed with CNA. Patient did not eat any lunch today, but occasionally eats some of his meals. Per chart has been bites to 20%.  Access: 18 Fr. [place] G-tube replaced 1/9 in ER after initial G-tube was clogged; terminates in body of stomach per abdominal x-ray 1/9  TF: patient tolerating Osmolite 1.5 5 times daily + Pro-Stat 30 mL BID with free water flush of 90 mL before and after each bolus feeding  Medications reviewed and  include: Keppra, magic mouthwash 10 mL TID, Miralax, Senokot, warfarin, NS @ 75 mL/hr.  Labs reviewed: Glucose 115.  I/O: 3 occurrences UOP today (unmeasured); 2 BM today  No weight since admission to trend.  Diet Order:  DIET SOFT Room service appropriate? Yes; Fluid consistency: Thin  EDUCATION NEEDS:   No education needs have been identified at this time  Skin:  Skin Assessment: Reviewed RN Assessment(ecchymosis to left arm)  Last BM:  01/22/2017 - smear type 6  Height:   Ht Readings from Last 1 Encounters:  01/16/17 5' 3"  (1.6 m)    Weight:   Wt Readings from Last 1 Encounters:  01/16/17 188 lb 11.2 oz (85.6 kg)    Ideal Body Weight:  56.4 kg  BMI:  Body mass index is 33.43 kg/m.  Estimated Nutritional Needs:   Kcal:  3202-3343 (MSJ x 1.3-1.5)  Protein:  85-110 grams (1-1.3 grams/kg)  Fluid:  1.7-2 L/day (30-35 mL/kg IBW)  Willey Blade, MS, RD, LDN Office: [phone] Pager: [phone] After Hours/Weekend Pager: [phone]

## 2017-02-27 ENCOUNTER — Encounter: Payer: Self-pay | Admitting: Emergency Medicine

## 2017-02-27 ENCOUNTER — Other Ambulatory Visit: Payer: Self-pay

## 2017-02-27 ENCOUNTER — Emergency Department
Admission: EM | Admit: 2017-02-27 | Discharge: 2017-02-27 | Disposition: A | Discharge status: Other Acute Inpatient Hospital | Payer: Medicare PPO | Attending: Student in an Organized Health Care Education/Training Program | Admitting: Student in an Organized Health Care Education/Training Program

## 2017-02-27 ENCOUNTER — Emergency Department: Payer: Medicare PPO

## 2017-02-27 DIAGNOSIS — D330 Benign neoplasm of brain, supratentorial: Secondary | ICD-10-CM

## 2017-02-27 DIAGNOSIS — Z86718 Personal history of other venous thrombosis and embolism: Secondary | ICD-10-CM | POA: Diagnosis not present

## 2017-02-27 DIAGNOSIS — R569 Unspecified convulsions: Secondary | ICD-10-CM | POA: Diagnosis present

## 2017-02-27 DIAGNOSIS — Z79899 Other long term (current) drug therapy: Secondary | ICD-10-CM | POA: Insufficient documentation

## 2017-02-27 DIAGNOSIS — G9340 Encephalopathy, unspecified: Secondary | ICD-10-CM | POA: Diagnosis not present

## 2017-02-27 DIAGNOSIS — M6289 Other specified disorders of muscle: Secondary | ICD-10-CM | POA: Insufficient documentation

## 2017-02-27 DIAGNOSIS — I10 Essential (primary) hypertension: Secondary | ICD-10-CM | POA: Diagnosis not present

## 2017-02-27 DIAGNOSIS — Z7901 Long term (current) use of anticoagulants: Secondary | ICD-10-CM | POA: Diagnosis not present

## 2017-02-27 DIAGNOSIS — R531 Weakness: Secondary | ICD-10-CM

## 2017-02-27 HISTORY — DX: Unspecified convulsions: R56.9

## 2017-02-27 HISTORY — DX: Hyperlipidemia, unspecified: E78.5

## 2017-02-27 HISTORY — DX: Acute embolism and thrombosis of unspecified deep veins of unspecified lower extremity: I82.409

## 2017-02-27 HISTORY — DX: Gastro-esophageal reflux disease without esophagitis: K21.9

## 2017-02-27 LAB — COMPREHENSIVE METABOLIC PANEL
ALBUMIN: 3.1 g/dL — AB (ref 3.5–5.0)
ALK PHOS: 165 U/L — AB (ref 38–126)
ALT: 33 U/L (ref 17–63)
ANION GAP: 8 (ref 5–15)
AST: 23 U/L (ref 15–41)
BUN: 16 mg/dL (ref 6–20)
CALCIUM: 9.1 mg/dL (ref 8.9–10.3)
CO2: 27 mmol/L (ref 22–32)
Chloride: 107 mmol/L (ref 101–111)
Creatinine, Ser: 0.72 mg/dL (ref 0.61–1.24)
GFR calc Af Amer: >60 mL/min (ref >60)
GFR calc non Af Amer: >60 mL/min (ref >60)
GLUCOSE: 120 mg/dL — AB (ref 65–99)
Potassium: 3.8 mmol/L (ref 3.5–5.1)
SODIUM: 142 mmol/L (ref 135–145)
Total Bilirubin: 0.3 mg/dL (ref 0.3–1.2)
Total Protein: 7.9 g/dL (ref 6.5–8.1)

## 2017-02-27 LAB — LACTIC ACID, PLASMA: Lactic Acid, Venous: 1.1 mmol/L (ref 0.5–1.9)

## 2017-02-27 LAB — CBC WITH DIFFERENTIAL/PLATELET
BASOS ABS: 0.1 10*3/uL (ref 0–0.1)
BASOS PCT: 1 %
Eosinophils Absolute: 0.1 10*3/uL (ref 0–0.7)
Eosinophils Relative: 1 %
HEMATOCRIT: 44.5 % (ref 40.0–52.0)
Hemoglobin: 14.1 g/dL (ref 13.0–18.0)
Lymphocytes Relative: 13 %
Lymphs Abs: 1.3 10*3/uL (ref 1.0–3.6)
MCH: 29.6 pg (ref 26.0–34.0)
MCHC: 31.7 g/dL — AB (ref 32.0–36.0)
MCV: 93.2 fL (ref 80.0–100.0)
Monocytes Absolute: 0.8 10*3/uL (ref 0.2–1.0)
Monocytes Relative: 8 %
NEUTROS ABS: 7.7 10*3/uL — AB (ref 1.4–6.5)
NEUTROS PCT: 77 %
Platelets: 156 10*3/uL (ref 150–440)
RBC: 4.77 MIL/uL (ref 4.40–5.90)
RDW: 15.5 % — ABNORMAL HIGH (ref 11.5–14.5)
WBC: 10 10*3/uL (ref 3.8–10.6)

## 2017-02-27 LAB — PROTIME-INR
INR: 1.31
PROTHROMBIN TIME: 16.2 s — AB (ref 11.4–15.2)

## 2017-02-27 LAB — TROPONIN I

## 2017-02-27 LAB — GLUCOSE, CAPILLARY: Glucose-Capillary: 104 mg/dL — ABNORMAL HIGH (ref 65–99)

## 2017-02-27 LAB — APTT: aPTT: 25 seconds (ref 24–36)

## 2017-02-27 MED ORDER — DEXAMETHASONE SODIUM PHOSPHATE 10 MG/ML IJ SOLN
4.0000 mg | Freq: Once | INTRAMUSCULAR | Status: AC
  Administered 2017-02-27: 4 mg via INTRAVENOUS
  Filled 2017-02-27: qty 1

## 2017-02-27 MED ORDER — SODIUM CHLORIDE 0.9 % IV SOLN
1000.0000 mg | Freq: Once | INTRAVENOUS | Status: AC
  Administered 2017-02-27: 1000 mg via INTRAVENOUS
  Filled 2017-02-27: qty 10

## 2017-02-27 MED ORDER — ASPIRIN 81 MG PO CHEW
324.0000 mg | CHEWABLE_TABLET | Freq: Once | ORAL | Status: AC
  Administered 2017-02-27: 324 mg via ORAL
  Filled 2017-02-27: qty 4

## 2017-02-27 NOTE — ED Provider Notes (Signed)
. -----------------------------------------   7:25 PM on 02/27/2017 ----------------------------------------- ] Stabilized prior to my arrival, in no acute distress while here, awaiting transport.  Transport has come to get him.  Patient is stable I believe for transport at this time.  He has not had any seizure activities guarding his airway he is awake and alert.   Schuyler Amor, MD 02/27/17 [phone]

## 2017-02-27 NOTE — ED Triage Notes (Addendum)
Pt arrives via [facility] from [facility]. Per staff, pt had increased dizziness during rehab this morning and began having an intermittent facial twitch over the past couple hours. Pt has hx of seizures.   During triage, pt had facial twitch episode and was responding with "yes."  Per daughter, pt does not look normal to her. Pt daughter states pt has only had seizures "one time in 2012 then has had them since brain surgeries (x4) in October 2018. Pt takes Keppra and dilantin 2x a day per daughter.

## 2017-02-27 NOTE — ED Notes (Signed)
ED Provider at bedside. 

## 2017-02-27 NOTE — ED Notes (Signed)
Pt family member signed hard copy of E-signature for the transfer consent. Placed in pt file

## 2017-02-27 NOTE — ED Provider Notes (Signed)
[facility] Emergency Department Provider Note    First MD Initiated Contact with Patient 02/27/17 1306     (approximate)  I have reviewed the triage vital signs and the nursing notes.   HISTORY  Chief Complaint Seizures  Level V Caveat: chronic encephalopathy - seizures  HPI Anthony Jackson is a 67 y.o. male with complex recent past medical history including prolonged ICU admission at [facility] status post craniotomy for tumor resection with multiple complications subsequently discharged to [facility] presenting to the ER today with altered mental status lightheadedness and a new facial twitch on the right side.  Patient still able to say yes but nonconversant.  Family at bedside states the patient is typically able to walk with assistance and is usually sitting up more alert.  No report of any other falls.  He is on Coumadin.  No report of any fevers.  No missed medications.  Past Medical History:  Diagnosis Date  . DVT (deep venous thrombosis) ([place])   . GERD (gastroesophageal reflux disease)   . Hyperlipidemia   . Hypertension   . Seizures ([place])    2012 then not until Oct 2018 surgery   Family History  Family history unknown: Yes   Past Surgical History:  Procedure Laterality Date  . 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 Sepsis ([place]) 01/16/2017      Prior to Admission medications   Medication Sig Start Date End Date Taking? Authorizing Provider  amantadine (SYMMETREL) 50 MG/5ML solution Place 100 mg into feeding tube 2 (two) times daily.   Yes [provider]  Amino Acids-Protein Hydrolys (FEEDING SUPPLEMENT, PRO-STAT SUGAR FREE 64,) LIQD Place 30 mLs into feeding tube 2 (two) times daily. 01/22/17  Yes Gouru, Illene Silver, MD  atorvastatin (LIPITOR) 10 MG tablet Place 10 mg into feeding tube daily.    Yes [provider]  bacitracin ointment Apply 1 application topically 2 (two) times daily.   Yes [provider]  diltiazem (CARDIZEM) 60 MG tablet Take 1 tablet (60 mg total) by mouth every 8 (eight) hours. 01/22/17  Yes Gouru, Illene Silver, MD  levETIRAcetam (KEPPRA) 1000 MG tablet Place 1,000 mg into feeding tube 2 (two) times daily.   Yes [provider]  metoprolol tartrate (LOPRESSOR) 100 MG tablet Place 100 mg into feeding tube 2 (two) times daily.   Yes [provider]  nystatin (MYCOSTATIN) 100000 UNIT/ML suspension Take 5 mLs by mouth 4 (four) times daily.   Yes [provider]  pantoprazole (PROTONIX) 20 MG tablet Take 20 mg by mouth 2 (two) times daily.   Yes [provider]  phenytoin (DILANTIN) 125 MG/5ML suspension Place 150 mg into feeding tube every 8 (eight) hours.   Yes [provider]  polyethylene glycol (MIRALAX / GLYCOLAX) packet Place 17 g into feeding tube daily.   Yes [provider]  senna (SENOKOT) 8.6 MG tablet Place 1 tablet into feeding tube daily.   Yes [provider]  warfarin (COUMADIN) 4 MG tablet Take 4 mg by mouth daily.   Yes [provider]  acetaminophen (TYLENOL) 325 MG tablet Place 650 mg into feeding tube every 6 (six) hours as needed.    [provider]  citalopram (CELEXA) 10 MG tablet Take 1 tablet (10 mg total) by mouth daily. Patient not taking: Reported on 02/27/2017 01/22/17  Nicholes Mango, MD  Nutritional Supplements (FEEDING SUPPLEMENT, OSMOLITE 1.5 CAL,) LIQD Place 237 mLs into feeding tube 5 (five) times daily. Patient not taking: Reported on 02/27/2017 01/22/17   Nicholes Mango, MD  tamsulosin (FLOMAX) 0.4 MG CAPS capsule 0.4 mg daily after breakfast.    [provider]  Water For Irrigation, Sterile (FREE WATER) SOLN Place 180 mLs into feeding tube 5 (five) times daily. 01/22/17   Nicholes Mango, MD    Allergies Patient has no known allergies.    Social History Social  History   Tobacco Use  . Smoking status: Unknown If Ever Smoked  Substance Use Topics  . Alcohol use: No    Frequency: Never  . Drug use: Not on file    Review of Systems Patient denies headaches, rhinorrhea, blurry vision, numbness, shortness of breath, chest pain, edema, cough, abdominal pain, nausea, vomiting, diarrhea, dysuria, fevers, rashes or hallucinations unless otherwise stated above in HPI. ____________________________________________    PHYSICAL EXAM:  VITAL SIGNS: Vitals:   02/27/17 1500 02/27/17 1530  BP: (!) 110/91 (!) 114/97  Pulse:    Resp: 16 19  Temp:    SpO2:      Constitutional: drowsy and encephalopathic, protecting his airway, no respiratory distress.,  GCS 10 (334) Eyes: exotropia, pupils 34mm and reactive Head: Atraumatic. Nose: No congestion/rhinnorhea. Mouth/Throat: Mucous membranes are moist.   Neck: No stridor. Painless ROM.  Cardiovascular: Normal rate, regular rhythm. Grossly normal heart sounds.  Good peripheral circulation. Respiratory: Normal respiratory effort.  No retractions. Lungs CTAB. Gastrointestinal: Soft and nontender. No distention. No abdominal bruits. No CVA tenderness. Gastrostomy tube c/d/i Genitourinary:  Musculoskeletal: No lower extremity tenderness nor edema.  No joint effusions. Neurologic:  Slurred speech, only responding with "yes", spontaneous movement of left UE, withdrawals to pain.  Right sided paresis, does moan to painful stimuli on the left Skin:  Skin is warm, dry and intact. No rash noted. Psychiatric: unable to assess 2/2 encephalpathy  ____________________________________________   LABS (all labs ordered are listed, but only abnormal results are displayed)  Results for orders placed or performed during the hospital encounter of 02/27/17 (from the past 24 hour(s))  CBC with Differential/Platelet     Status: Abnormal   Collection Time: 02/27/17  1:05 PM  Result Value Ref Range   WBC 10.0 3.8 - 10.6 K/uL     RBC 4.77 4.40 - 5.90 MIL/uL   Hemoglobin 14.1 13.0 - 18.0 g/dL   HCT 44.5 40.0 - 52.0 %   MCV 93.2 80.0 - 100.0 fL   MCH 29.6 26.0 - 34.0 pg   MCHC 31.7 (L) 32.0 - 36.0 g/dL   RDW 15.5 (H) 11.5 - 14.5 %   Platelets 156 150 - 440 K/uL   Neutrophils Relative % 77 %   Neutro Abs 7.7 (H) 1.4 - 6.5 K/uL   Lymphocytes Relative 13 %   Lymphs Abs 1.3 1.0 - 3.6 K/uL   Monocytes Relative 8 %   Monocytes Absolute 0.8 0.2 - 1.0 K/uL   Eosinophils Relative 1 %   Eosinophils Absolute 0.1 0 - 0.7 K/uL   Basophils Relative 1 %   Basophils Absolute 0.1 0 - 0.1 K/uL  Comprehensive metabolic panel     Status: Abnormal   Collection Time: 02/27/17  1:05 PM  Result Value Ref Range   Sodium 142 135 - 145 mmol/L   Potassium 3.8 3.5 - 5.1 mmol/L   Chloride 107 101 - 111 mmol/L   CO2 27 22 - 32  mmol/L   Glucose, Bld 120 (H) 65 - 99 mg/dL   BUN 16 6 - 20 mg/dL   Creatinine, Ser 0.72 0.61 - 1.24 mg/dL   Calcium 9.1 8.9 - 10.3 mg/dL   Total Protein 7.9 6.5 - 8.1 g/dL   Albumin 3.1 (L) 3.5 - 5.0 g/dL   AST 23 15 - 41 U/L   ALT 33 17 - 63 U/L   Alkaline Phosphatase 165 (H) 38 - 126 U/L   Total Bilirubin 0.3 0.3 - 1.2 mg/dL   GFR calc non Af Amer >60 >60 mL/min   GFR calc Af Amer >60 >60 mL/min   Anion gap 8 5 - 15  Glucose, capillary     Status: Abnormal   Collection Time: 02/27/17  1:28 PM  Result Value Ref Range   Glucose-Capillary 104 (H) 65 - 99 mg/dL  Lactic acid, plasma     Status: None   Collection Time: 02/27/17  2:42 PM  Result Value Ref Range   Lactic Acid, Venous 1.1 0.5 - 1.9 mmol/L  Protime-INR     Status: Abnormal   Collection Time: 02/27/17  2:42 PM  Result Value Ref Range   Prothrombin Time 16.2 (H) 11.4 - 15.2 seconds   INR 1.31    ____________________________________________  EKG My review and personal interpretation at Time: 12:59   Indication: ams  Rate: 110  Rhythm: sinus Axis: normal Other:  No stemi, occasional  pvc ____________________________________________  RADIOLOGY I personally reviewed all radiographic images ordered to evaluate for the above acute complaints and reviewed radiology reports and findings.  These findings were personally discussed with the patient.  Please see medical record for radiology report.  ____________________________________________   PROCEDURES  Procedure(s) performed:  .Critical Care Performed by: Merlyn Lot, MD Authorized by: Merlyn Lot, MD   Critical care provider statement:    Critical care time (minutes):  40   Critical care time was exclusive of:  Separately billable procedures and treating other patients   Critical care was necessary to treat or prevent imminent or life-threatening deterioration of the following conditions:  CNS failure or compromise   Critical care was time spent personally by me on the following activities:  Development of treatment plan with patient or surrogate, discussions with consultants, evaluation of patient's response to treatment, examination of patient, obtaining history from patient or surrogate, ordering and performing treatments and interventions, ordering and review of laboratory studies, ordering and review of radiographic studies, pulse oximetry, re-evaluation of patient's condition and review of old charts      Critical Care performed: yes  ____________________________________________   INITIAL IMPRESSION / [place] / ED COURSE  Pertinent labs & imaging results that were available during my care of the patient were reviewed by me and considered in my medical decision making (see chart for details).  DDX: sdh, iph, cva, mass, edema, hydro, encephalitis, electrolyte abn  Uriah Philipson is a 67 y.o. who presents to the ED with status as described above he is afebrile hemodynamically stable but is ill-appearing with very complex recent medical history.  Current GCS is 10 as he is to be  protecting his airway.  Does not seem to be following commands and is encephalopathic.  Taken emergently to CT head shows evidence of probable recurrence of tumor with decreased gray-white differentiation concerning for vasogenic edema versus stroke.  No evidence of acute bleed.  Less consistent with meningitis.  Blood work is otherwise reassuring.  Patient will be given a loading dose  of Keppra due to report and concern for seizure but presentation is not consistent with status.    She has been accepted to [facility] in neuro ICU.  Spoke with Dr. Domenica Fail who agrees with giving dose of Decadron.  Patient remains hemodynamically stable.  Still mixed picture whether this is resultant of stroke versus recurrence of mass with edema.  Patient certainly not a candidate for TPA based on known malignancy.  Family updated at bedside.  Have discussed with the patient and available family all diagnostics and treatments performed thus far and all questions were answered to the best of my ability. The patient demonstrates understanding and agreement with plan.   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    Merlyn Lot, MD 02/27/17 [phone]

## 2017-02-27 NOTE — ED Notes (Signed)
[facility] here to transport to [facility]

## 2017-02-27 NOTE — ED Notes (Signed)
[facility] here to transport to [facility]

## 2017-02-27 NOTE — ED Notes (Signed)
EMTALA checked for completion  

## 2017-04-08 DEATH — deceased

## 2018-04-18 IMAGING — CT CT HEAD W/O CM
3 series · 14 of 47 positions shown, 16 images · non-contrast
Comparison: None.

CLINICAL DATA: Altered level of consciousness. Previous brain
surgeries.

EXAM:
CT HEAD WITHOUT CONTRAST
TECHNIQUE: Contiguous axial images were obtained from the base of the skull
through the vertex without intravenous contrast.

[Series 2: head wo · axial · 0.47mm/px · z∈[-106,+19]mm · 8 of 31 slices shown, 10 images]
[im 3/31  brain]
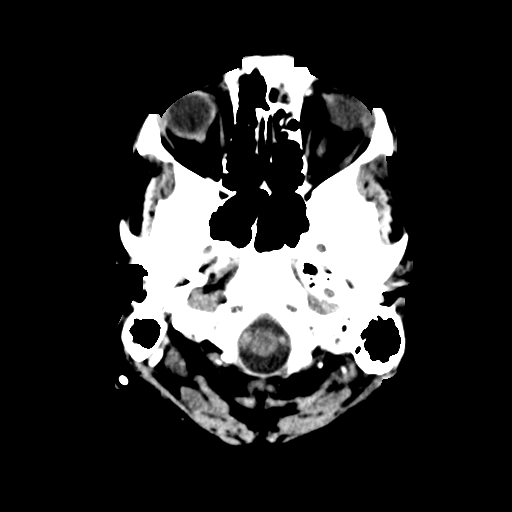
[im 3/31  bone]
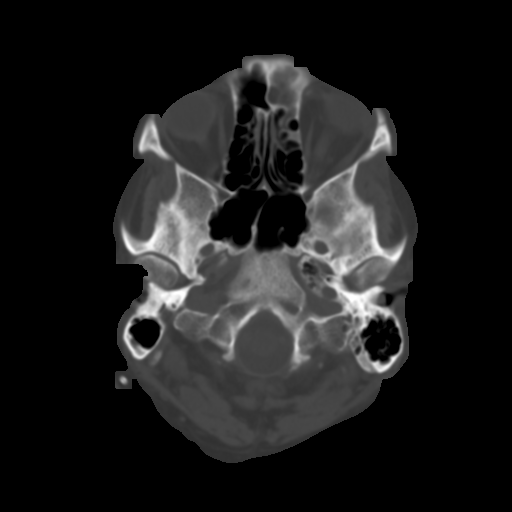
[im 7/31  brain]
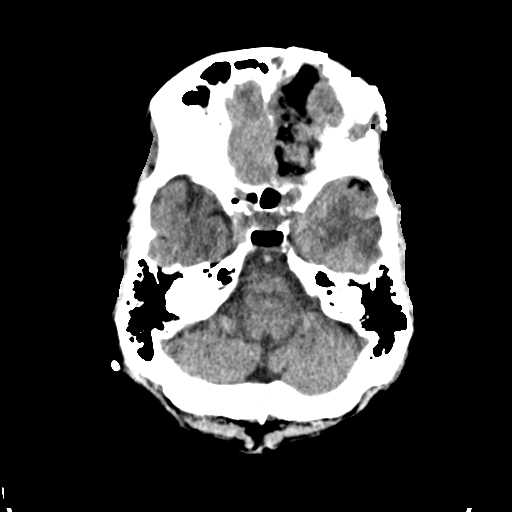
[im 10/31  brain]
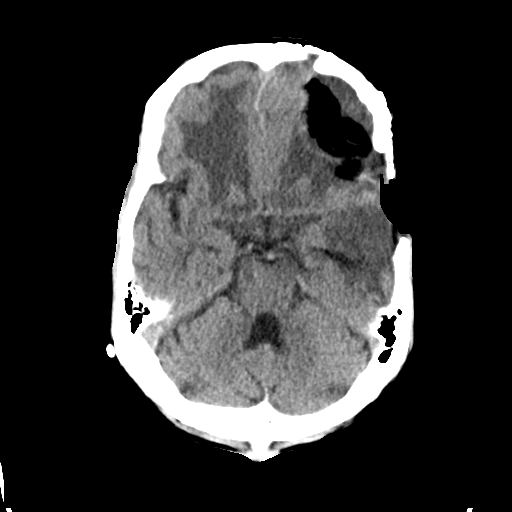
[im 14/31  brain]
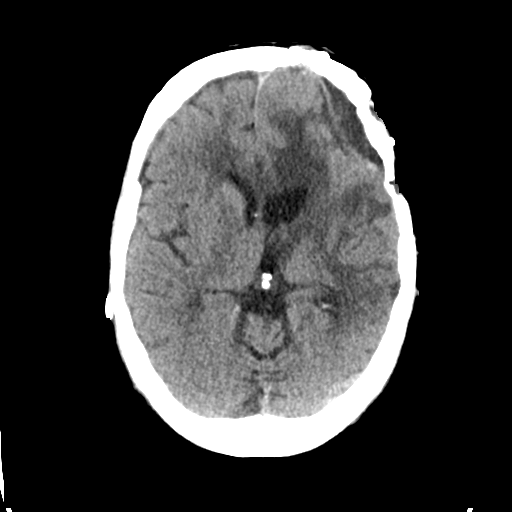
[im 17/31  brain]
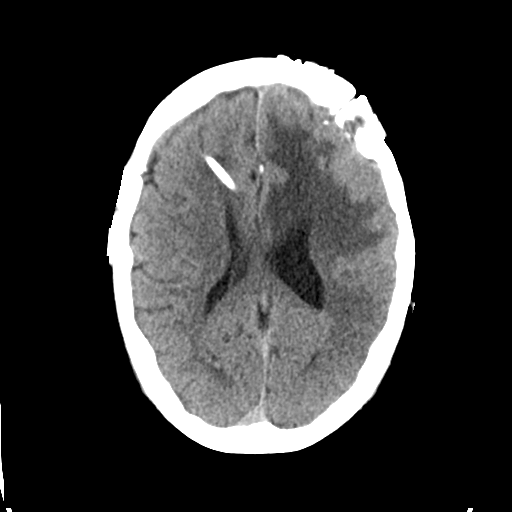
[im 17/31  bone]
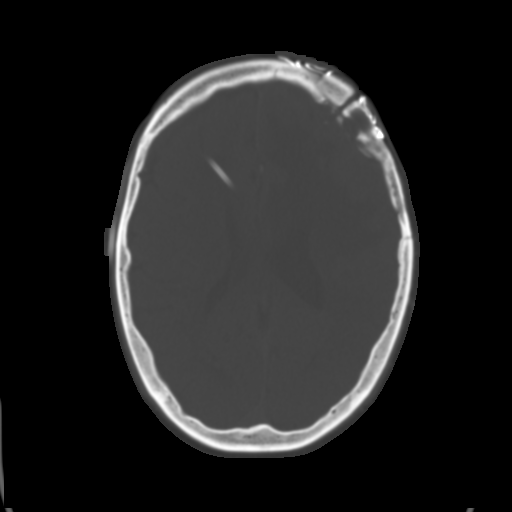
[im 21/31  brain]
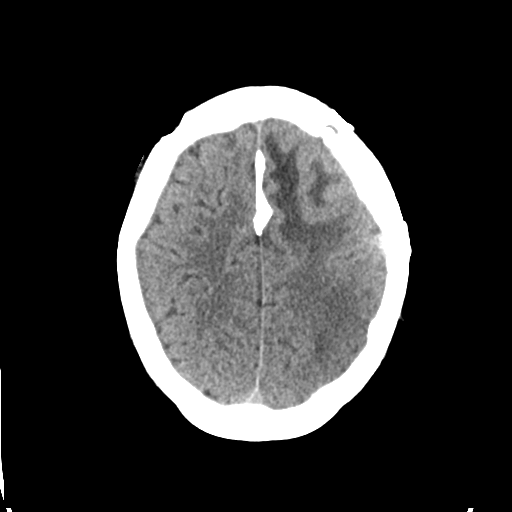
[im 24/31  brain]
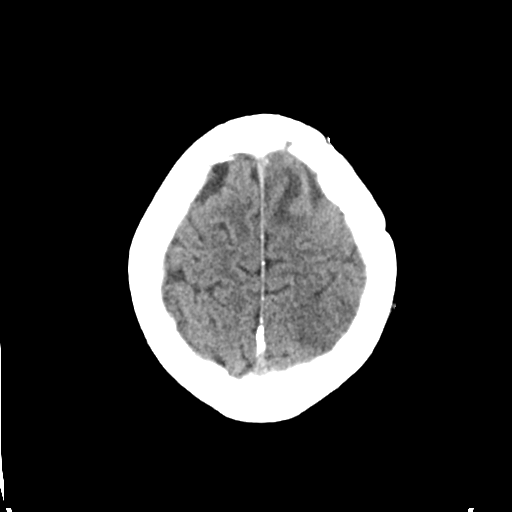
[im 28/31  brain]
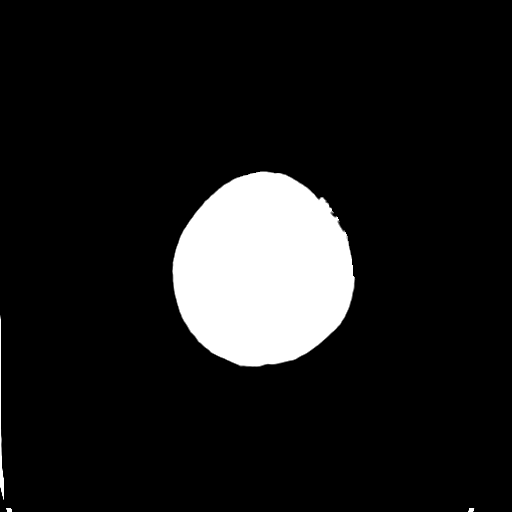

[Series 4: coronal soft tissue · coronal · 0.33mm/px · 3 of 64 slices shown]
[im 22/64  brain]
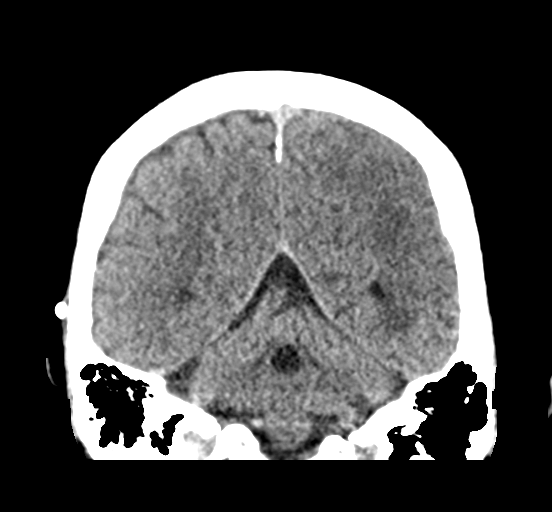
[im 29/64  brain]
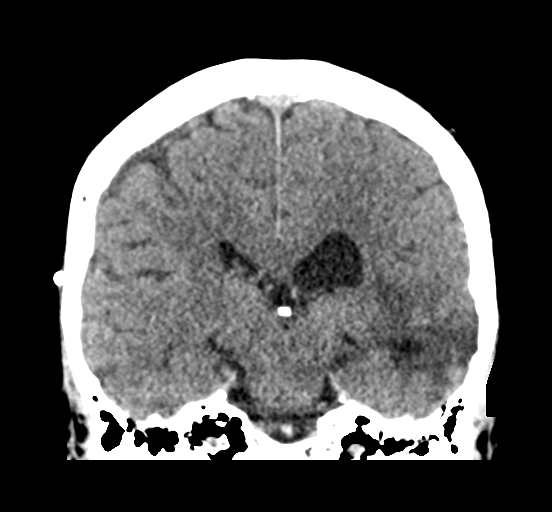
[im 36/64  brain]
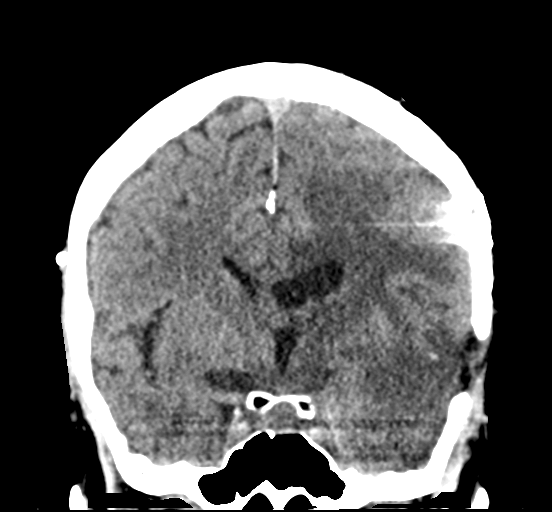

[Series 5: sagittal soft tissue · sagittal · 0.32mm/px · 3 of 52 slices shown]
[im 18/52  brain]
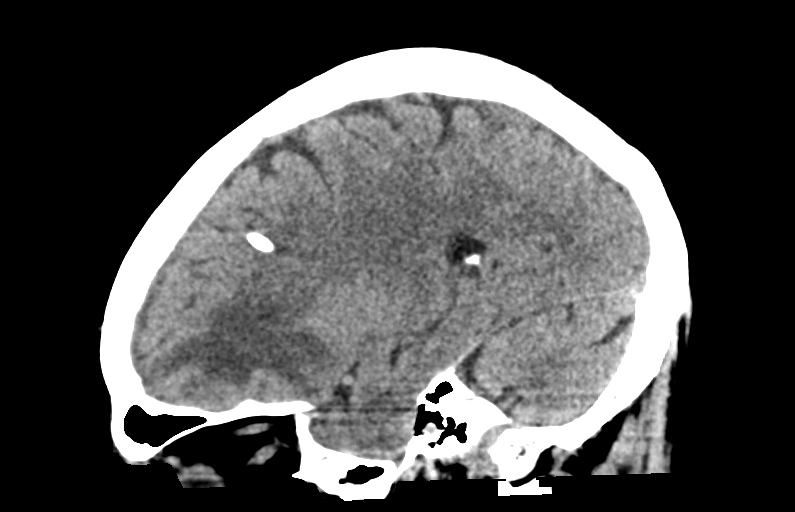
[im 26/52  brain]
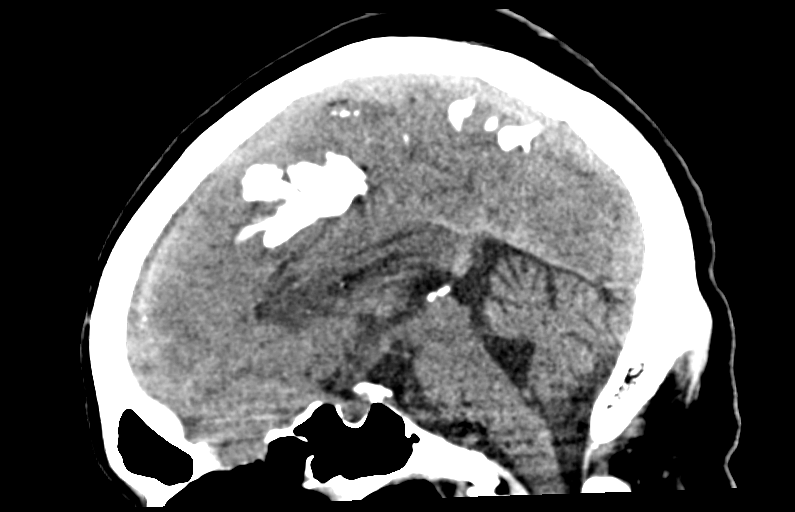
[im 35/52  brain]
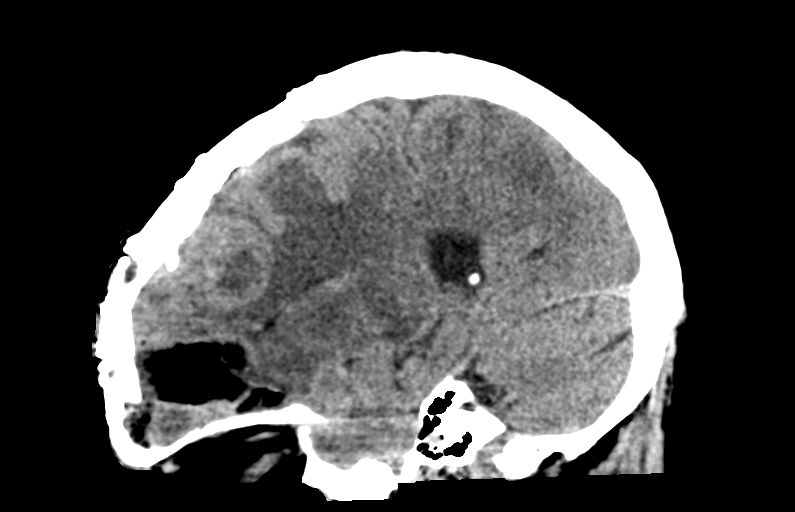

[14 of 47 positions shown; findings below may reference images not displayed]

FINDINGS: Brain: Left frontal craniotomy is noted. Fat grafting or packing
material is noted in the epidural space subjacent to the craniotomy.
Residual or recurrent tumor mass is noted anteriorly and medially
measuring 2.0 x 2.3 x 2.5 cm. Encephalomalacia and edema is present
in the left frontal lobe. There is diffuse loss of gray-white
differentiation in the left parietal lobe with sulcal effacement.
This may represent a more acute infarct. Right frontal
ventriculostomy catheter is in place. Ex vacuo dilation of the left
lateral ventricle is associated with chronic encephalomalacia.
Brainstem and cerebellum are normal.

Vascular: No hyperdense vessel or unexpected calcification.

Skull: Left frontal craniotomy is noted. Right frontal burr hole is
present. No focal lytic or blastic lesions are present.

Sinuses/Orbits: There is fat packing in the left frontal sinus. The
remaining paranasal sinuses are clear. Cerumen is noted in the
external auditory canals bilaterally. The globes and orbits are
within normal limits bilaterally.
IMPRESSION: 1. Left frontal craniotomy. Surgery extends into the left frontal
sinus. Extradural fat packing is noted.
2. Mass-like lesion in the anteromedial left frontal lobe is
concerning for tumor recurrence measuring 2.3 x 2.5 x 2.0 cm.
3. Extensive chronic encephalomalacia and white matter
hypoattenuation in the left frontal lobe with volume loss.
4. More subtle loss of gray-white differentiation in the left
parietal lobe is concerning for acute/subacute infarct.
5. MRI would be more sensitive and specific for evaluation of acute
infarct or recurrent tumor.
6. Right frontal ventriculostomy catheter without hydrocephalus.
